# Patient Record
Sex: Female | Born: 1970 | Race: Black or African American | Hispanic: No | Marital: Married | Smoking: Never smoker
Health system: Southern US, Community
[De-identification: ages and names within clinical notes are randomized; demographics above are authoritative.]

## PROBLEM LIST (undated history)

## (undated) DIAGNOSIS — R7303 Prediabetes: Secondary | ICD-10-CM

## (undated) DIAGNOSIS — K5900 Constipation, unspecified: Secondary | ICD-10-CM

## (undated) DIAGNOSIS — M549 Dorsalgia, unspecified: Secondary | ICD-10-CM

## (undated) DIAGNOSIS — E538 Deficiency of other specified B group vitamins: Secondary | ICD-10-CM

## (undated) DIAGNOSIS — R0602 Shortness of breath: Secondary | ICD-10-CM

## (undated) DIAGNOSIS — E559 Vitamin D deficiency, unspecified: Secondary | ICD-10-CM

## (undated) DIAGNOSIS — M7989 Other specified soft tissue disorders: Secondary | ICD-10-CM

## (undated) DIAGNOSIS — E669 Obesity, unspecified: Secondary | ICD-10-CM

## (undated) HISTORY — DX: Shortness of breath: R06.02

## (undated) HISTORY — DX: Deficiency of other specified B group vitamins: E53.8

## (undated) HISTORY — DX: Dorsalgia, unspecified: M54.9

## (undated) HISTORY — DX: Constipation, unspecified: K59.00

## (undated) HISTORY — DX: Prediabetes: R73.03

## (undated) HISTORY — PX: NO PAST SURGERIES: SHX2092

## (undated) HISTORY — DX: Other specified soft tissue disorders: M79.89

## (undated) HISTORY — DX: Vitamin D deficiency, unspecified: E55.9

---

## 2011-02-02 ENCOUNTER — Inpatient Hospital Stay: Payer: Self-pay | Admitting: Obstetrics and Gynecology

## 2014-12-11 ENCOUNTER — Emergency Department: Payer: Self-pay | Admitting: Emergency Medicine

## 2019-10-29 ENCOUNTER — Other Ambulatory Visit: Payer: Self-pay | Admitting: Podiatry

## 2019-10-29 DIAGNOSIS — M25572 Pain in left ankle and joints of left foot: Secondary | ICD-10-CM

## 2019-11-03 ENCOUNTER — Other Ambulatory Visit: Payer: Self-pay

## 2019-11-03 ENCOUNTER — Ambulatory Visit
Admission: RE | Admit: 2019-11-03 | Discharge: 2019-11-03 | Disposition: A | Discharge status: Home / Self Care | Payer: BC Managed Care – PPO | Source: Ambulatory Visit | Attending: Podiatry | Admitting: Podiatry

## 2019-11-03 DIAGNOSIS — M25572 Pain in left ankle and joints of left foot: Secondary | ICD-10-CM

## 2019-11-26 ENCOUNTER — Other Ambulatory Visit: Payer: Self-pay

## 2019-11-26 ENCOUNTER — Encounter
Admission: RE | Admit: 2019-11-26 | Discharge: 2019-11-26 | Disposition: A | Discharge status: Home / Self Care | Payer: BC Managed Care – PPO | Source: Ambulatory Visit | Attending: Podiatry | Admitting: Podiatry

## 2019-11-26 HISTORY — DX: Obesity, unspecified: E66.9

## 2019-11-26 NOTE — Patient Instructions (Addendum)
Your procedure is scheduled on: Friday, March 5 Report to Day Surgery on the 2nd floor of the [facility] To find out your arrival time, please call [phone] between 1PM - 3PM on: Thursday, March 4  REMEMBER: Instructions that are not followed completely may result in serious medical risk, up to and including death; or upon the discretion of your surgeon and anesthesiologist your surgery may need to be rescheduled.  Do not eat food after midnight the night before surgery.  No gum chewing, lozengers or hard candies.  You may however, drink CLEAR liquids up to 2 hours before you are scheduled to arrive for your surgery. Do not drink anything within 2 hours of the start of your surgery.  Clear liquids include: - water  - apple juice without pulp - gatorade (not RED) - black coffee or tea (Do NOT add milk or creamers to the coffee or tea) Do NOT drink anything that is not on this list.  TAKE THESE MEDICATIONS THE MORNING OF SURGERY WITH A SIP OF WATER:  NONE  Stop Anti-inflammatories (NSAIDS) such as Advil, Aleve, Ibuprofen, Motrin, Naproxen, Naprosyn and Aspirin based products such as Excedrin, Goodys Powder, BC Powder. (May take Tylenol or Acetaminophen if needed.)  Stop ANY OVER THE COUNTER supplements until after surgery.  No Alcohol for 24 hours before or after surgery.  No Smoking including e-cigarettes for 24 hours prior to surgery.  No chewable tobacco products for at least 6 hours prior to surgery.  No nicotine patches on the day of surgery.  On the morning of surgery brush your teeth with toothpaste and water, you may rinse your mouth with mouthwash if you wish. Do not swallow any toothpaste or mouthwash.  Do not wear jewelry, make-up, hairpins, clips or nail polish.  Do not wear lotions, powders, or perfumes.   Do not shave 48 hours prior to surgery.   Contact lenses, hearing aids and dentures may not be worn into surgery.  Do not bring valuables to the  hospital, including drivers license, insurance or credit cards.  [facility] is not responsible for any belongings or valuables.   Use CHG Soap as directed on instruction sheet.  Notify your doctor if there is any change in your medical condition (cold, fever, infection).  Wear comfortable clothing (specific to your surgery type) to the hospital.  If you are being admitted to the hospital overnight, leave your suitcase in the car. After surgery it may be brought to your room.  If you are being discharged the day of surgery, you will not be allowed to drive home. You will need a responsible adult to drive you home and stay with you that night.   If you are taking public transportation, you will need to have a responsible adult with you. Please confirm with your physician that it is acceptable to use public transportation.   Please call [phone] if you have any questions about these instructions.   Visitation Policy:  Patients undergoing a surgery or procedure in a hospital may have one family member or support person with them as long as that person is not COVID-19 positive or experiencing its symptoms. That person may remain in the waiting area during the procedure. Should the patient need to stay at the hospital during part of their recovery, the support person may visit during visiting hours; 10 am to 8 pm.

## 2019-11-27 ENCOUNTER — Other Ambulatory Visit: Payer: Self-pay | Admitting: Podiatry

## 2019-12-03 ENCOUNTER — Other Ambulatory Visit: Payer: Self-pay

## 2019-12-03 ENCOUNTER — Other Ambulatory Visit
Admission: RE | Admit: 2019-12-03 | Discharge: 2019-12-03 | Disposition: A | Discharge status: Home / Self Care | Payer: BC Managed Care – PPO | Source: Ambulatory Visit | Attending: Podiatry | Admitting: Podiatry

## 2019-12-03 DIAGNOSIS — Z20822 Contact with and (suspected) exposure to covid-19: Secondary | ICD-10-CM | POA: Diagnosis not present

## 2019-12-03 DIAGNOSIS — Z01812 Encounter for preprocedural laboratory examination: Secondary | ICD-10-CM | POA: Insufficient documentation

## 2019-12-03 LAB — BASIC METABOLIC PANEL
Anion gap: 12 (ref 5–15)
BUN: 16 mg/dL (ref 6–20)
CO2: 26 mmol/L (ref 22–32)
Calcium: 9.1 mg/dL (ref 8.9–10.3)
Chloride: 101 mmol/L (ref 98–111)
Creatinine, Ser: 0.81 mg/dL (ref 0.44–1.00)
GFR calc Af Amer: >60 mL/min (ref >60)
GFR calc non Af Amer: >60 mL/min (ref >60)
Glucose, Bld: 84 mg/dL (ref 70–99)
Potassium: 3.7 mmol/L (ref 3.5–5.1)
Sodium: 139 mmol/L (ref 135–145)

## 2019-12-03 LAB — SARS CORONAVIRUS 2 (TAT 6-24 HRS): SARS Coronavirus 2: NEGATIVE

## 2019-12-05 ENCOUNTER — Ambulatory Visit: Payer: BC Managed Care – PPO | Admitting: Anesthesiology

## 2019-12-05 ENCOUNTER — Ambulatory Visit
Admission: RE | Admit: 2019-12-05 | Discharge: 2019-12-05 | Disposition: A | Discharge status: Home / Self Care | Payer: BC Managed Care – PPO | Source: Ambulatory Visit | Attending: Podiatry | Admitting: Podiatry

## 2019-12-05 ENCOUNTER — Ambulatory Visit: Payer: BC Managed Care – PPO

## 2019-12-05 ENCOUNTER — Encounter: Payer: Self-pay | Admitting: Podiatry

## 2019-12-05 ENCOUNTER — Encounter
Admission: RE | Disposition: A | Discharge status: Home / Self Care | Payer: Self-pay | Source: Ambulatory Visit | Attending: Podiatry

## 2019-12-05 DIAGNOSIS — Z419 Encounter for procedure for purposes other than remedying health state, unspecified: Secondary | ICD-10-CM

## 2019-12-05 DIAGNOSIS — M19072 Primary osteoarthritis, left ankle and foot: Secondary | ICD-10-CM | POA: Diagnosis not present

## 2019-12-05 DIAGNOSIS — Z6841 Body Mass Index (BMI) 40.0 and over, adult: Secondary | ICD-10-CM | POA: Insufficient documentation

## 2019-12-05 HISTORY — PX: ANKLE ARTHROSCOPY: SHX545

## 2019-12-05 HISTORY — PX: ARTHROTOMY: SHX5728

## 2019-12-05 HISTORY — PX: FOOT ARTHRODESIS: SHX1655

## 2019-12-05 LAB — POCT PREGNANCY, URINE: Preg Test, Ur: NEGATIVE

## 2019-12-05 SURGERY — FUSION, JOINT, FOOT
Anesthesia: General | Site: Foot | Laterality: Left

## 2019-12-05 MED ORDER — MIDAZOLAM HCL 2 MG/2ML IJ SOLN
INTRAMUSCULAR | Status: AC
  Filled 2019-12-05: qty 2

## 2019-12-05 MED ORDER — FAMOTIDINE 20 MG PO TABS
ORAL_TABLET | ORAL | Status: AC
  Administered 2019-12-05: 20 mg via ORAL
  Filled 2019-12-05: qty 1

## 2019-12-05 MED ORDER — PHENYLEPHRINE HCL (PRESSORS) 10 MG/ML IV SOLN
INTRAVENOUS | Status: DC | PRN
  Administered 2019-12-05: 100 ug via INTRAVENOUS

## 2019-12-05 MED ORDER — EPINEPHRINE PF 1 MG/ML IJ SOLN
INTRAMUSCULAR | Status: AC
  Filled 2019-12-05: qty 1

## 2019-12-05 MED ORDER — ROCURONIUM BROMIDE 100 MG/10ML IV SOLN
INTRAVENOUS | Status: DC | PRN
  Administered 2019-12-05: 30 mg via INTRAVENOUS
  Administered 2019-12-05: 10 mg via INTRAVENOUS

## 2019-12-05 MED ORDER — BUPIVACAINE LIPOSOME 1.3 % IJ SUSP
INTRAMUSCULAR | Status: DC | PRN
  Administered 2019-12-05 (×2): 10 mL

## 2019-12-05 MED ORDER — FENTANYL CITRATE (PF) 100 MCG/2ML IJ SOLN
INTRAMUSCULAR | Status: AC
  Filled 2019-12-05: qty 2

## 2019-12-05 MED ORDER — PROMETHAZINE HCL 25 MG/ML IJ SOLN
6.2500 mg | INTRAMUSCULAR | Status: DC | PRN
  Administered 2019-12-05: 6.25 mg via INTRAVENOUS

## 2019-12-05 MED ORDER — BUPIVACAINE HCL (PF) 0.25 % IJ SOLN
INTRAMUSCULAR | Status: AC
  Filled 2019-12-05: qty 30

## 2019-12-05 MED ORDER — BUPIVACAINE LIPOSOME 1.3 % IJ SUSP
INTRAMUSCULAR | Status: AC
  Filled 2019-12-05: qty 20

## 2019-12-05 MED ORDER — SUCCINYLCHOLINE CHLORIDE 20 MG/ML IJ SOLN
INTRAMUSCULAR | Status: AC
  Filled 2019-12-05: qty 1

## 2019-12-05 MED ORDER — DEXAMETHASONE SODIUM PHOSPHATE 10 MG/ML IJ SOLN
INTRAMUSCULAR | Status: DC | PRN
  Administered 2019-12-05: 10 mg via INTRAVENOUS

## 2019-12-05 MED ORDER — FENTANYL CITRATE (PF) 100 MCG/2ML IJ SOLN
INTRAMUSCULAR | Status: AC
  Administered 2019-12-05: 25 ug via INTRAVENOUS
  Filled 2019-12-05: qty 2

## 2019-12-05 MED ORDER — PROPOFOL 10 MG/ML IV BOLUS
INTRAVENOUS | Status: AC
  Filled 2019-12-05: qty 20

## 2019-12-05 MED ORDER — ACETAMINOPHEN 10 MG/ML IV SOLN
INTRAVENOUS | Status: AC
  Filled 2019-12-05: qty 100

## 2019-12-05 MED ORDER — DEXMEDETOMIDINE HCL IN NACL 200 MCG/50ML IV SOLN
INTRAVENOUS | Status: AC
  Filled 2019-12-05: qty 50

## 2019-12-05 MED ORDER — ACETAMINOPHEN 10 MG/ML IV SOLN
INTRAVENOUS | Status: DC | PRN
  Administered 2019-12-05: 1000 mg via INTRAVENOUS

## 2019-12-05 MED ORDER — CEFAZOLIN SODIUM-DEXTROSE 2-4 GM/100ML-% IV SOLN: 2.0000 g | INTRAVENOUS | Status: DC

## 2019-12-05 MED ORDER — ASPIRIN EC 325 MG PO TBEC: 325.0000 mg | DELAYED_RELEASE_TABLET | Freq: Every day | ORAL | 3 refills | Status: AC

## 2019-12-05 MED ORDER — DEXAMETHASONE SODIUM PHOSPHATE 10 MG/ML IJ SOLN
INTRAMUSCULAR | Status: AC
  Filled 2019-12-05: qty 1

## 2019-12-05 MED ORDER — LACTATED RINGERS IV SOLN: INTRAVENOUS | Status: DC

## 2019-12-05 MED ORDER — PROPOFOL 500 MG/50ML IV EMUL
INTRAVENOUS | Status: AC
  Filled 2019-12-05: qty 50

## 2019-12-05 MED ORDER — OXYCODONE-ACETAMINOPHEN 5-325 MG PO TABS: 1.0000 | ORAL_TABLET | Freq: Four times a day (QID) | ORAL | 0 refills | Status: AC | PRN

## 2019-12-05 MED ORDER — LACTATED RINGERS IV SOLN: INTRAVENOUS | Status: DC | PRN

## 2019-12-05 MED ORDER — FENTANYL CITRATE (PF) 100 MCG/2ML IJ SOLN
INTRAMUSCULAR | Status: AC
  Administered 2019-12-05: 11:00:00 50 ug via INTRAVENOUS
  Filled 2019-12-05: qty 2

## 2019-12-05 MED ORDER — SUGAMMADEX SODIUM 500 MG/5ML IV SOLN
INTRAVENOUS | Status: DC | PRN
  Administered 2019-12-05: 200 mg via INTRAVENOUS

## 2019-12-05 MED ORDER — LIDOCAINE HCL (PF) 2 % IJ SOLN
INTRAMUSCULAR | Status: AC
  Filled 2019-12-05: qty 5

## 2019-12-05 MED ORDER — CEFAZOLIN SODIUM-DEXTROSE 2-4 GM/100ML-% IV SOLN
INTRAVENOUS | Status: AC
  Filled 2019-12-05: qty 100

## 2019-12-05 MED ORDER — FENTANYL CITRATE (PF) 100 MCG/2ML IJ SOLN
INTRAMUSCULAR | Status: DC | PRN
  Administered 2019-12-05 (×3): 50 ug via INTRAVENOUS
  Administered 2019-12-05 (×2): 25 ug via INTRAVENOUS

## 2019-12-05 MED ORDER — SEVOFLURANE IN SOLN
RESPIRATORY_TRACT | Status: AC
  Filled 2019-12-05: qty 250

## 2019-12-05 MED ORDER — PROPOFOL 10 MG/ML IV BOLUS
INTRAVENOUS | Status: DC | PRN
  Administered 2019-12-05: 30 mg via INTRAVENOUS
  Administered 2019-12-05: 200 mg via INTRAVENOUS

## 2019-12-05 MED ORDER — POVIDONE-IODINE 7.5 % EX SOLN
Freq: Once | CUTANEOUS | Status: DC
  Filled 2019-12-05: qty 118

## 2019-12-05 MED ORDER — SUCCINYLCHOLINE CHLORIDE 20 MG/ML IJ SOLN
INTRAMUSCULAR | Status: DC | PRN
  Administered 2019-12-05: 140 mg via INTRAVENOUS

## 2019-12-05 MED ORDER — ENOXAPARIN SODIUM 40 MG/0.4ML ~~LOC~~ SOLN
SUBCUTANEOUS | Status: AC
  Filled 2019-12-05: qty 0.4

## 2019-12-05 MED ORDER — FAMOTIDINE 20 MG PO TABS: 20.0000 mg | ORAL_TABLET | Freq: Once | ORAL | Status: AC

## 2019-12-05 MED ORDER — ROCURONIUM BROMIDE 50 MG/5ML IV SOLN
INTRAVENOUS | Status: AC
  Filled 2019-12-05: qty 1

## 2019-12-05 MED ORDER — LIDOCAINE HCL (CARDIAC) PF 100 MG/5ML IV SOSY
PREFILLED_SYRINGE | INTRAVENOUS | Status: DC | PRN
  Administered 2019-12-05: 100 mg via INTRAVENOUS

## 2019-12-05 MED ORDER — DEXMEDETOMIDINE HCL 200 MCG/2ML IV SOLN
INTRAVENOUS | Status: DC | PRN
  Administered 2019-12-05: 12 ug via INTRAVENOUS

## 2019-12-05 MED ORDER — ENOXAPARIN SODIUM 40 MG/0.4ML ~~LOC~~ SOLN
40.0000 mg | Freq: Once | SUBCUTANEOUS | Status: AC
  Administered 2019-12-05: 13:00:00 40 mg via SUBCUTANEOUS

## 2019-12-05 MED ORDER — PROMETHAZINE HCL 25 MG/ML IJ SOLN
INTRAMUSCULAR | Status: AC
  Filled 2019-12-05: qty 1

## 2019-12-05 MED ORDER — EPHEDRINE SULFATE 50 MG/ML IJ SOLN
INTRAMUSCULAR | Status: AC
  Filled 2019-12-05: qty 1

## 2019-12-05 MED ORDER — ONDANSETRON HCL 4 MG/2ML IJ SOLN
INTRAMUSCULAR | Status: AC
  Filled 2019-12-05: qty 2

## 2019-12-05 MED ORDER — BUPIVACAINE-EPINEPHRINE 0.25% -1:200000 IJ SOLN
INTRAMUSCULAR | Status: DC | PRN
  Administered 2019-12-05: 20 mL
  Administered 2019-12-05: 10 mL

## 2019-12-05 MED ORDER — LIDOCAINE HCL (PF) 1 % IJ SOLN
INTRAMUSCULAR | Status: AC
  Filled 2019-12-05: qty 30

## 2019-12-05 MED ORDER — FENTANYL CITRATE (PF) 100 MCG/2ML IJ SOLN
25.0000 ug | INTRAMUSCULAR | Status: DC | PRN
  Administered 2019-12-05 (×3): 25 ug via INTRAVENOUS

## 2019-12-05 MED ORDER — LIDOCAINE-EPINEPHRINE 1 %-1:100000 IJ SOLN
INTRAMUSCULAR | Status: AC
  Filled 2019-12-05: qty 1

## 2019-12-05 SURGICAL SUPPLY — 79 items
2.0 DRILL BIT STRATUM ×4 IMPLANT
BASIN GRAD PLASTIC 32OZ STRL (MISCELLANEOUS) ×4 IMPLANT
BIT DRILL 2.0 (BIT) ×1
BIT DRILL 2XNS DISP SS SM FRAG (BIT) ×3 IMPLANT
BIT DRILL STRATUM ST W/SLV 2.7 (BIT) ×3 IMPLANT
BIT DRL 2XNS DISP SS SM FRAG (BIT) ×3
BLADE FULL RADIUS 2.9 (BLADE) ×4 IMPLANT
BLADE SURG 15 STRL LF DISP TIS (BLADE) ×6 IMPLANT
BLADE SURG 15 STRL SS (BLADE) ×2
BNDG COHESIVE 4X5 TAN STRL (GAUZE/BANDAGES/DRESSINGS) ×4 IMPLANT
BNDG CONFORM 2 STRL LF (GAUZE/BANDAGES/DRESSINGS) ×4 IMPLANT
BNDG CONFORM 3 STRL LF (GAUZE/BANDAGES/DRESSINGS) ×4 IMPLANT
BNDG ELASTIC 4X5.8 VLCR NS LF (GAUZE/BANDAGES/DRESSINGS) ×8 IMPLANT
BNDG ESMARK 4X12 TAN STRL LF (GAUZE/BANDAGES/DRESSINGS) ×4 IMPLANT
BNDG GAUZE 4.5X4.1 6PLY STRL (MISCELLANEOUS) ×4 IMPLANT
BOOT STEPPER DURA MED (SOFTGOODS) ×4 IMPLANT
BUR 4X45 EGG (BURR) ×4 IMPLANT
BUR AGGRESSIVE+ 2.5 (BURR) IMPLANT
CANISTER SUCT 1200ML W/VALVE (MISCELLANEOUS) ×4 IMPLANT
COVER PIN YLW 0.028-062 (MISCELLANEOUS) ×8 IMPLANT
COVER WAND RF STERILE (DRAPES) ×4 IMPLANT
CUFF TOURN SGL QUICK 18X4 (TOURNIQUET CUFF) IMPLANT
CUFF TOURN SGL QUICK 24 (TOURNIQUET CUFF) ×1
CUFF TRNQT CYL 24X4X16.5-23 (TOURNIQUET CUFF) ×3 IMPLANT
DRAPE C-ARM XRAY 36X54 (DRAPES) ×4 IMPLANT
DRAPE C-ARMOR (DRAPES) IMPLANT
DRAPE FLUOR MINI C-ARM 54X84 (DRAPES) ×4 IMPLANT
DRILL STRATUM ST W/SLV 2.7 (BIT) ×4
DURAPREP 26ML APPLICATOR (WOUND CARE) ×4 IMPLANT
ELECT REM PT RETURN 9FT ADLT (ELECTROSURGICAL) ×4
ELECTRODE REM PT RTRN 9FT ADLT (ELECTROSURGICAL) ×3 IMPLANT
GAUZE SPONGE 4X4 12PLY STRL (GAUZE/BANDAGES/DRESSINGS) ×4 IMPLANT
GAUZE XEROFORM 1X8 LF (GAUZE/BANDAGES/DRESSINGS) ×4 IMPLANT
GLOVE BIO SURGEON STRL SZ7.5 (GLOVE) ×4 IMPLANT
GLOVE BIO SURGEON STRL SZ8 (GLOVE) ×4 IMPLANT
GLOVE INDICATOR 8.0 STRL GRN (GLOVE) ×4 IMPLANT
GOWN STRL REUS W/ TWL LRG LVL3 (GOWN DISPOSABLE) ×6 IMPLANT
GOWN STRL REUS W/TWL LRG LVL3 (GOWN DISPOSABLE) ×2
GRAFT TRIN ELITE MED MUSC TRAN (Graft) ×4 IMPLANT
IV D 5 LR 1000ML (IV SOLUTION) ×4 IMPLANT
IV NS 1000ML (IV SOLUTION) ×1
IV NS 1000ML BAXH (IV SOLUTION) ×3 IMPLANT
IV NS IRRIG 3000ML ARTHROMATIC (IV SOLUTION) ×4 IMPLANT
K-WIRE ACE 1.6X6 (WIRE) ×16
KIT STRATUM INSTRUMENT STD (KITS) ×4 IMPLANT
KIT TURNOVER KIT A (KITS) ×4 IMPLANT
KWIRE ACE 1.6X6 (WIRE) ×12 IMPLANT
NEEDLE FILTER BLUNT 18X 1/2SAF (NEEDLE) ×2
NEEDLE FILTER BLUNT 18X1 1/2 (NEEDLE) ×6 IMPLANT
NEEDLE HYPO 25X1 1.5 SAFETY (NEEDLE) ×8 IMPLANT
NS IRRIG 1000ML POUR BTL (IV SOLUTION) ×12 IMPLANT
PACK EXTREMITY ARMC (MISCELLANEOUS) ×4 IMPLANT
PADDING CAST BLEND 4X4 NS (MISCELLANEOUS) ×4 IMPLANT
PLATE TN STRM LG LT (Plate) ×4 IMPLANT
RASP SM TEAR CROSS CUT (RASP) ×4 IMPLANT
SCREW LOCK ST STRM 2.7X18 (Screw) ×4 IMPLANT
SCREW LOCK ST STRM 2.7X24 (Screw) ×4 IMPLANT
SCREW LOCK STRATUM 3.5X18 (Screw) ×8 IMPLANT
SCREW NL LP ST STRM 2.7X24 (Screw) IMPLANT
SCREW STRATUM NL LP ST 3.5X24 (Screw) ×4 IMPLANT
SPLINT CAST 1 STEP 4X30 (MISCELLANEOUS) ×4 IMPLANT
SPLINT FAST PLASTER 5X30 (CAST SUPPLIES) ×1
SPLINT PLASTER CAST FAST 5X30 (CAST SUPPLIES) ×3 IMPLANT
SPONGE XRAY 4X4 16PLY STRL (MISCELLANEOUS) ×4 IMPLANT
STOCKINETTE M/LG 89821 (MISCELLANEOUS) ×4 IMPLANT
STRIP CLOSURE SKIN 1/4X4 (GAUZE/BANDAGES/DRESSINGS) ×4 IMPLANT
SUT ETHILON 3-0 FS-10 30 BLK (SUTURE) ×8
SUT ETHILON 4-0 (SUTURE) ×1
SUT ETHILON 4-0 FS2 18XMFL BLK (SUTURE) ×3
SUT VIC AB 3-0 SH 27 (SUTURE) ×2
SUT VIC AB 3-0 SH 27X BRD (SUTURE) ×6 IMPLANT
SUT VIC AB 4-0 FS2 27 (SUTURE) IMPLANT
SUTURE EHLN 3-0 FS-10 30 BLK (SUTURE) ×6 IMPLANT
SUTURE ETHLN 4-0 FS2 18XMF BLK (SUTURE) ×3 IMPLANT
SYR 10ML LL (SYRINGE) ×8 IMPLANT
Stratum foot plating system ×4 IMPLANT
TOWEL OR 17X26 4PK STRL BLUE (TOWEL DISPOSABLE) ×4 IMPLANT
TUBING ARTHRO INFLOW-ONLY STRL (TUBING) ×4 IMPLANT
WIRE Z .062 C-WIRE SPADE TIP (WIRE) IMPLANT

## 2019-12-05 NOTE — H&P (Signed)
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  The various methods of treatment have been discussed with the patient.  No guarantees were given.  After consideration of risks, benefits and other options for treatment, the patient has consented to surgery.  I have reviewed the patients' chart and labs.     A history and physical examination was performed in my office.  The patient was reexamined.  There have been no changes to this history and physical examination.  Gwyneth Revels A

## 2019-12-05 NOTE — Anesthesia Preprocedure Evaluation (Signed)
Anesthesia Evaluation  Patient identified by MRN, date of birth, ID band Patient awake    Reviewed: Allergy & Precautions, H&P , NPO status , Patient's Chart, lab work & pertinent test results, reviewed documented beta blocker date and time   History of Anesthesia Complications Negative for: history of anesthetic complications  Airway Mallampati: IV  TM Distance: >3 FB Neck ROM: full    Dental  (+) Dental Advidsory Given, Teeth Intact, Missing   Pulmonary neg pulmonary ROS,    Pulmonary exam normal        Cardiovascular Exercise Tolerance: Good negative cardio ROS Normal cardiovascular exam     Neuro/Psych negative neurological ROS  negative psych ROS   GI/Hepatic negative GI ROS, Neg liver ROS,   Endo/Other  neg diabetesMorbid obesity  Renal/GU negative Renal ROS  negative genitourinary   Musculoskeletal   Abdominal   Peds  Hematology negative hematology ROS (+)   Anesthesia Other Findings Past Medical History: No date: Obesity   Reproductive/Obstetrics negative OB ROS                             Anesthesia Physical Anesthesia Plan  ASA: III  Anesthesia Plan: General   Post-op Pain Management:    Induction: Intravenous  PONV Risk Score and Plan: 3 and Ondansetron, Dexamethasone, Midazolam, Promethazine and Treatment may vary due to age or medical condition  Airway Management Planned: Oral ETT  Additional Equipment:   Intra-op Plan:   Post-operative Plan: Extubation in OR  Informed Consent: I have reviewed the patients History and Physical, chart, labs and discussed the procedure including the risks, benefits and alternatives for the proposed anesthesia with the patient or authorized representative who has indicated his/her understanding and acceptance.     Dental Advisory Given  Plan Discussed with: Anesthesiologist, CRNA and Surgeon  Anesthesia Plan Comments:          Anesthesia Quick Evaluation

## 2019-12-05 NOTE — Transfer of Care (Signed)
Immediate Anesthesia Transfer of Care Note  Patient: Renee Brennan  Procedure(s) Performed: ARTHRODESIS FOOT T-N,C-C OR MET-CUNEIFORM LEFT (Left Foot) ARTHROTOMY (Left ) SUBTALOR ARTHROSCOPY (Left Ankle)  Patient Location: PACU  Anesthesia Type:General  Level of Consciousness: sedated  Airway & Oxygen Therapy: Patient Spontanous Breathing and Patient connected to face mask oxygen  Post-op Assessment: Report given to RN and Post -op Vital signs reviewed and stable  Post vital signs: Reviewed and stable  Last Vitals:  Vitals Value Taken Time  BP 117/74 12/05/19 1105  Temp 36.6 C 12/05/19 1105  Pulse 97 12/05/19 1109  Resp 22 12/05/19 1109  SpO2 97 % 12/05/19 1109  Vitals shown include unvalidated device data.  Last Pain:  Vitals:   12/05/19 0614  TempSrc: Tympanic  PainSc: 4          Complications: No apparent anesthesia complications

## 2019-12-05 NOTE — Anesthesia Procedure Notes (Signed)
Procedure Name: Intubation Date/Time: 12/05/2019 7:44 AM Performed by: Almeta Monas, CRNA Pre-anesthesia Checklist: Patient identified, Patient being monitored, Timeout performed, Emergency Drugs available and Suction available Patient Re-evaluated:Patient Re-evaluated prior to induction Oxygen Delivery Method: Circle system utilized Preoxygenation: Pre-oxygenation with 100% oxygen Induction Type: IV induction Ventilation: Mask ventilation without difficulty Laryngoscope Size: 3 and McGraph Grade View: Grade I Tube type: Oral Tube size: 7.0 mm Number of attempts: 1 Airway Equipment and Method: Stylet Placement Confirmation: ETT inserted through vocal cords under direct vision,  positive ETCO2 and breath sounds checked- equal and bilateral Secured at: 21 cm Tube secured with: Tape Dental Injury: Teeth and Oropharynx as per pre-operative assessment  Difficulty Due To: Difficulty was anticipated and Difficult Airway- due to large tongue Future Recommendations: Recommend- induction with short-acting agent, and alternative techniques readily available

## 2019-12-05 NOTE — Op Note (Signed)
Operative note   Surgeon:Manon Banbury Lawyer: None    Preop diagnosis: 1.  Talonavicular arthritis left foot 2.  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  Talonavicular arthrodesis left foot 2.  Subtalar joint arthroscopy with synovectomy and extensive debridement left subtalar joint    EBL: 10 mL    Anesthesia:local and general.  Local consisted of a total of 20 mL of Exparel long-acting anesthetic and 30 mL of 0.25% bupivacaine with epinephrine    Hemostasis: Mid calf tourniquet inflated to 200 mmHg for 120 minutes    Specimen: None    Complications: None    Operative indications:Renee Brennan is an 49 y.o. that presents today for surgical intervention.  The risks/benefits/alternatives/complications have been discussed and consent has been given.    Procedure:  Patient was brought into the OR and placed on the operating table in thesupine position. After anesthesia was obtained theleft lower extremity was prepped and draped in usual sterile fashion.  Attention was directed to the anterolateral aspect of the left subtalar joint region where a small incision was made overlying the sinus tarsi and subtalar joint.  The deep tissue was left intact.  This was a proximate 2 cm incision.  Initially the small 2.7 mm arthroscope was placed into the subtalar joint.  A large amount of synovitis and nonviable fibrotic tissue was noted.  With a small shaver extensive debridement and removal of fibrotic tissue was removed from the subtalar joint at the level of the sinus tarsi region.  Good excision and removal of inflammatory tissue was noted.  The wound was flushed with copious amounts of irrigation.  Closure was then performed with a 3-0 Vicryl the deeper and subcutaneous tissue and a 3-0 nylon for skin.  Next the tourniquet was inflated and attention was directed to the dorsal aspect of the foot where longitudinal incision  was made just medial to the anterior tibial tendon.  Sharp and blunt dissection carried down to the periosteum.  Subperiosteal dissection was undertaken both medial and lateral and the entire talonavicular joint was exposed.  There was noted to be a large synostotic bridge on the dorsal aspect and marked limitation of range of motion of the talonavicular joint was noted at this time.  Care was taken to remove the synostosis and the joint was then opened.  All residual articular cartilage was removed from the talonavicular joint at this time.  The joint was then prepped with a 2.0 mm drill bit.  The area was then filtrated with 10 mL of Trinity bone graft into the joint.  Next using standard technique a large talonavicular arthrodesis plate was placed on the dorsal medial talonavicular joint with compression.  This was a Biomet Stratum large talonavicular arthrodesis plate.  Excellent compression was noted and stability was noted.  At this point the wound was then flushed with copious amounts of irrigation.  Closure was then performed with a 3-0 Vicryl the deeper and subcutaneous tissue and a 3-0 nylon for skin.  The area was infiltrated with residual local anesthetic.  A large compression dressing was placed to the left foot and ankle.    Patient tolerated the procedure and anesthesia well.  Was transported from the OR to the PACU with all vital signs stable and vascular status intact. To be discharged per routine protocol.  Will follow up in approximately 1 week in the outpatient clinic.  Patient was  placed in an equalizer walker boot in the PACU.  Also a Percocet prescription was sent to her pharmacy.

## 2019-12-05 NOTE — Anesthesia Postprocedure Evaluation (Signed)
Anesthesia Post Note  Patient: Renee Brennan  Procedure(s) Performed: ARTHRODESIS FOOT T-N,C-C OR MET-CUNEIFORM LEFT (Left Foot) ARTHROTOMY (Left ) SUBTALOR ARTHROSCOPY (Left Ankle)  Patient location during evaluation: PACU Anesthesia Type: General Level of consciousness: awake and alert Pain management: pain level controlled Vital Signs Assessment: post-procedure vital signs reviewed and stable Respiratory status: spontaneous breathing, nonlabored ventilation, respiratory function stable and patient connected to nasal cannula oxygen Cardiovascular status: blood pressure returned to baseline and stable Postop Assessment: no apparent nausea or vomiting Anesthetic complications: no     Last Vitals:  Vitals:   12/05/19 1233 12/05/19 1250  BP: 94/72 128/79  Pulse: 62 69  Resp: 18 18  Temp: (!) 36.3 C   SpO2: 94% 100%    Last Pain:  Vitals:   12/05/19 1233  TempSrc: Temporal  PainSc: 4                  Martha Clan

## 2019-12-05 NOTE — Discharge Instructions (Addendum)
[facility]  POST OPERATIVE INSTRUCTIONS FOR DR. TROXLER, DR. Ether Griffins, AND [facility]  1. Take your medication as prescribed.  Pain medication should be taken only as needed.  2. Keep the dressing clean, dry and intact.  3. Keep your foot elevated above the heart level for the first 48 hours.  4. Walking to the bathroom and brief periods of walking are acceptable, unless we have instructed you to be non-weight bearing.  5. Always wear your post-op shoe when walking.  Always use your crutches if you are to be non-weight bearing.  6. Do not take a shower. Baths are permissible as long as the foot is kept out of the water.   7. Every hour you are awake:  - Bend your knee 15 times. - Flex foot 15 times - Massage calf 15 times  8. Call [facility] [phone]) if any of the following problems occur: - You develop a temperature or fever. - The bandage becomes saturated with blood. - Medication does not stop your pain. - Injury of the foot occurs. - Any symptoms of infection including redness, odor, or red streaks running from wound.    YOU ARE NON WEIGHT BEARING PER DR. Ether Griffins    AMBULATORY SURGERY  DISCHARGE INSTRUCTIONS   1) The drugs that you were given will stay in your system until tomorrow so for the next 24 hours you should not:  A) Drive an automobile B) Make any legal decisions C) Drink any alcoholic beverage   2) You may resume regular meals tomorrow.  Today it is better to start with liquids and gradually work up to solid foods.  You may eat anything you prefer, but it is better to start with liquids, then soup and crackers, and gradually work up to solid foods.   3) Please notify your doctor immediately if you have any unusual bleeding, trouble breathing, redness and pain at the surgery site, drainage, fever, or pain not relieved by medication.    4) Additional  Instructions:        Please contact your physician with any problems or Same Day Surgery at [phone], Monday through Friday 6 am to 4 pm, or [place] [facility] number at [phone].

## 2020-08-21 ENCOUNTER — Ambulatory Visit: Payer: Self-pay | Attending: Internal Medicine

## 2020-08-21 DIAGNOSIS — Z23 Encounter for immunization: Secondary | ICD-10-CM

## 2020-08-21 NOTE — Progress Notes (Signed)
° °  Covid-19 Vaccination Clinic  Name:  Renee Brennan    MRN: [id number] DOB: 08-20-1971  08/21/2020  [name] was observed post Covid-19 immunization for 15 minutes without incident. She was provided with Vaccine Information Sheet and instruction to access the V-Safe system.   [name] was instructed to call 911 with any severe reactions post vaccine:  Difficulty breathing   Swelling of face and throat   A fast heartbeat   A bad rash all over body   Dizziness and weakness   Immunizations Administered    Name Date Dose VIS Date Route   Pfizer COVID-19 Vaccine 08/21/2020 12:57 PM 0.3 mL 07/21/2020 Intramuscular   Manufacturer: [employer], [employer]   Lot: [id number]   NDC: [address]-5

## 2020-09-11 ENCOUNTER — Ambulatory Visit: Payer: Self-pay | Attending: Internal Medicine

## 2020-09-11 DIAGNOSIS — Z23 Encounter for immunization: Secondary | ICD-10-CM

## 2020-09-11 NOTE — Progress Notes (Signed)
° °  Covid-19 Vaccination Clinic  Name:  CORTINA VULTAGGIO    MRN: [id number] DOB: April 14, 1971  09/11/2020  Ms. Rossi was observed post Covid-19 immunization for 15 minutes without incident. She was provided with Vaccine Information Sheet and instruction to access the V-Safe system.   Ms. Schlueter was instructed to call 911 with any severe reactions post vaccine:  Difficulty breathing   Swelling of face and throat   A fast heartbeat   A bad rash all over body   Dizziness and weakness   Immunizations Administered    Name Date Dose VIS Date Route   Pfizer COVID-19 Vaccine 09/11/2020 10:01 AM 0.3 mL 07/21/2020 Intramuscular   Manufacturer: [employer]   Lot: [id number]   NDC: [id number]

## 2020-09-17 ENCOUNTER — Other Ambulatory Visit: Payer: Self-pay | Admitting: Internal Medicine

## 2020-09-17 DIAGNOSIS — Z1231 Encounter for screening mammogram for malignant neoplasm of breast: Secondary | ICD-10-CM

## 2020-11-30 ENCOUNTER — Other Ambulatory Visit: Payer: Self-pay

## 2020-11-30 ENCOUNTER — Ambulatory Visit
Admission: RE | Admit: 2020-11-30 | Discharge: 2020-11-30 | Disposition: A | Discharge status: Home / Self Care | Payer: 59 | Source: Ambulatory Visit | Attending: Internal Medicine | Admitting: Internal Medicine

## 2020-11-30 DIAGNOSIS — Z1231 Encounter for screening mammogram for malignant neoplasm of breast: Secondary | ICD-10-CM | POA: Diagnosis present

## 2021-06-25 LAB — COLOGUARD: COLOGUARD: NEGATIVE

## 2021-11-08 IMAGING — MG MM DIGITAL SCREENING BILAT W/ TOMO AND CAD
8 of 15 series · 8 of 40 positions shown · non-contrast
Comparison: None.

ACR Breast Density Category a: The breast tissue is almost entirely
fatty.

CLINICAL DATA: Screening.

EXAM:
DIGITAL SCREENING BILATERAL MAMMOGRAM WITH TOMOSYNTHESIS AND CAD
TECHNIQUE: Bilateral screening digital craniocaudal and mediolateral oblique
mammograms were obtained. Bilateral screening digital breast
tomosynthesis was performed. The images were evaluated with
computer-aided detection.

[R XCCL synth-2D]
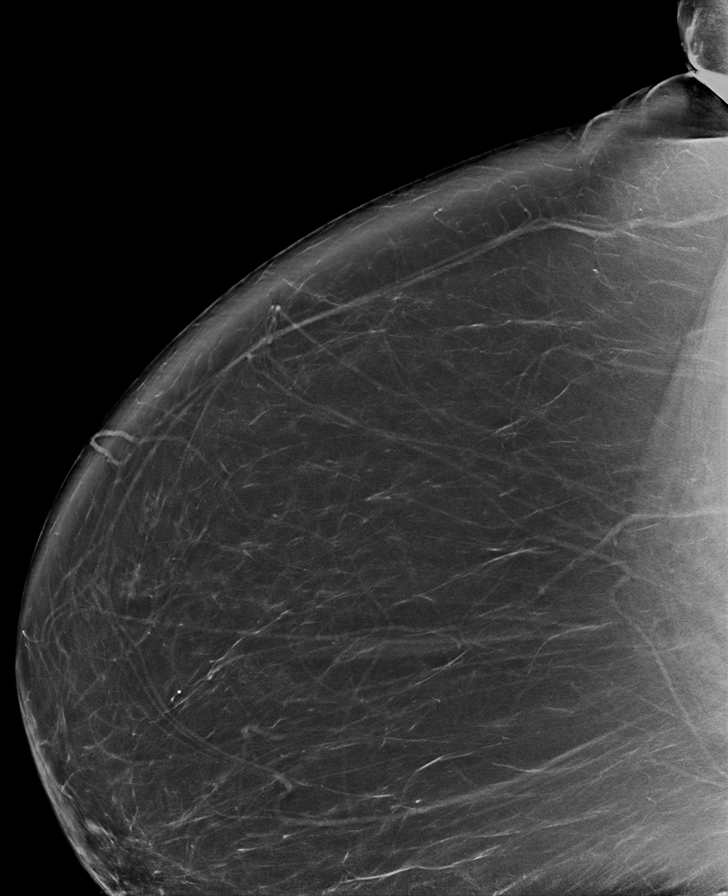

[L MLO synth-2D (1 of 2)]
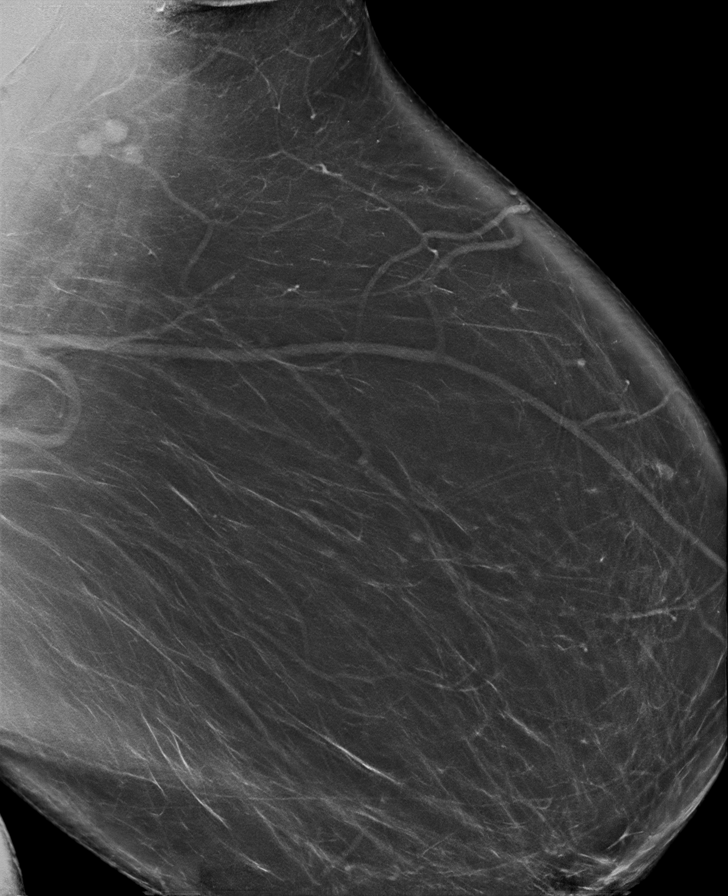

[R MLO synth-2D (1 of 2)]
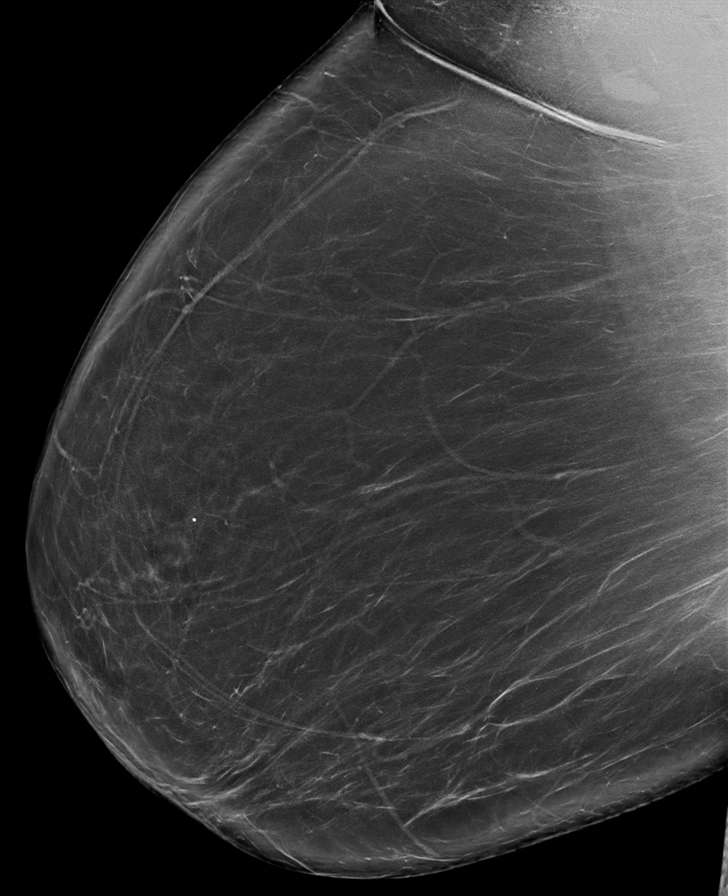

[L CC synth-2D (1 of 2)]
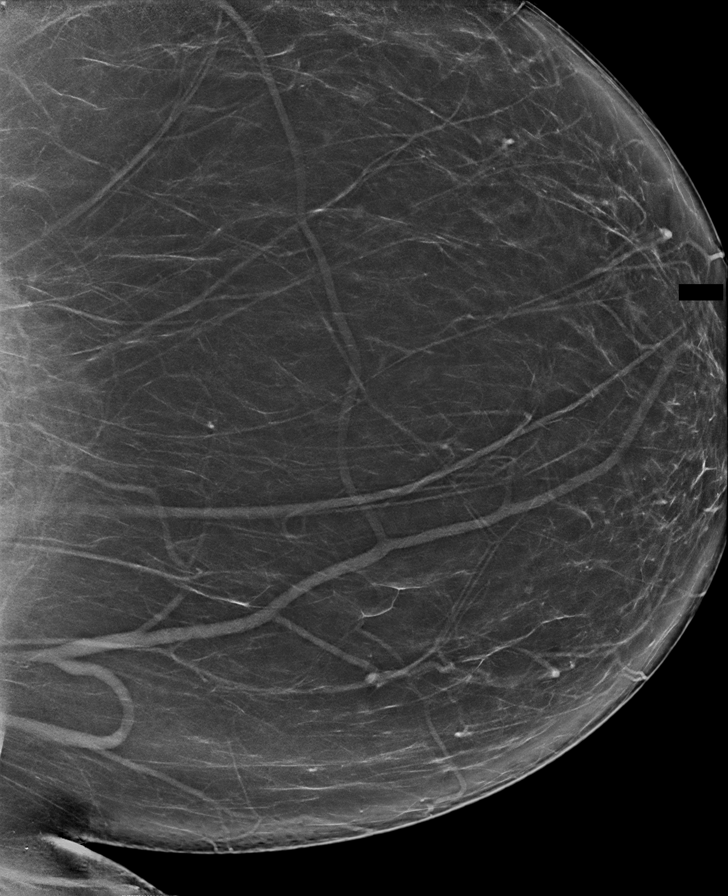

[R MLO synth-2D (2 of 2)]
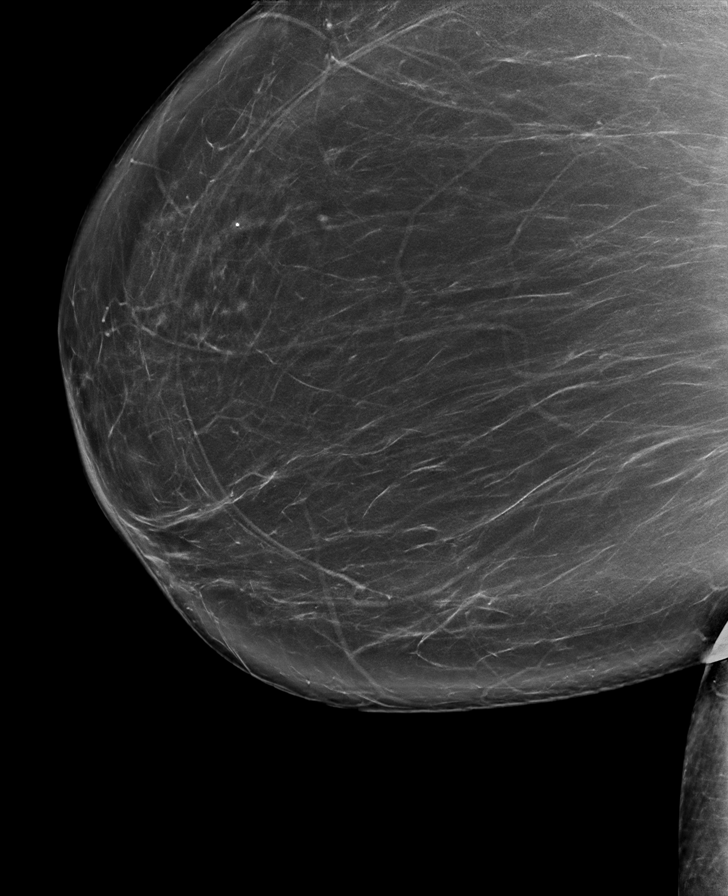

[L CC synth-2D (2 of 2)]
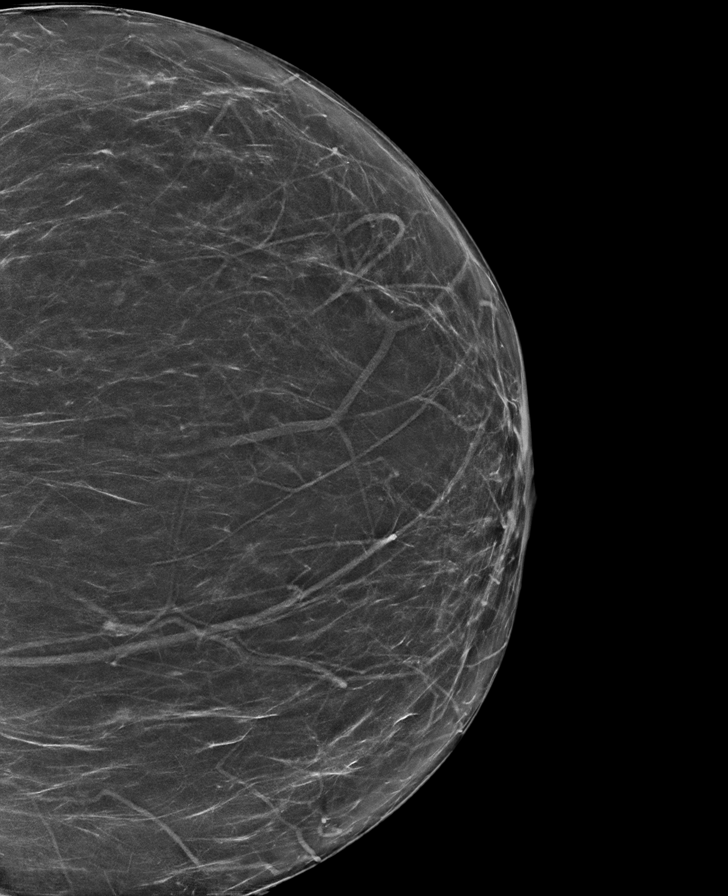

[L MLO synth-2D (2 of 2)]
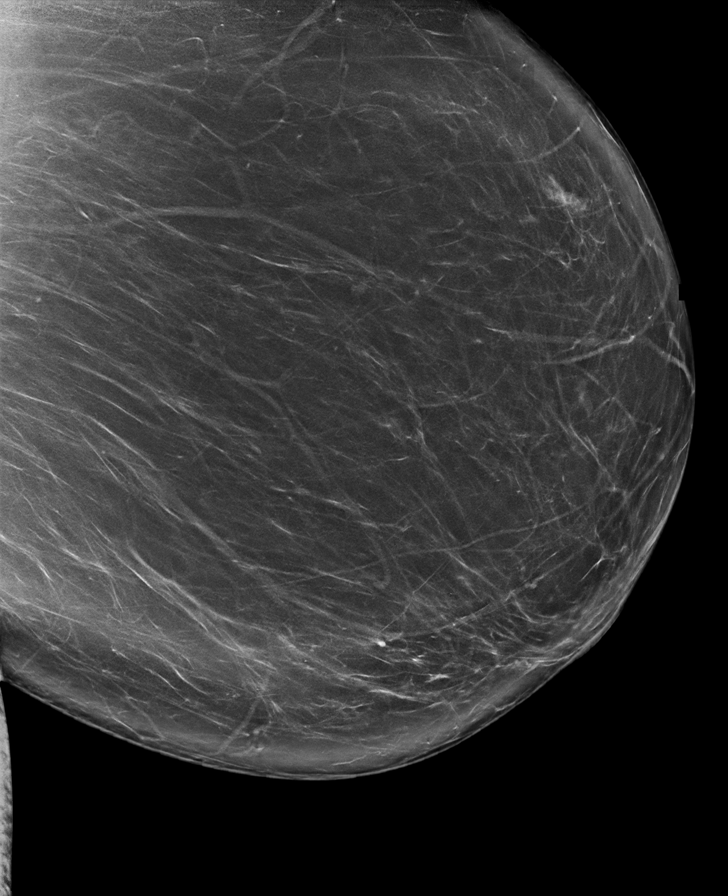

[L CC tomo · tomo slice 47/92.0]
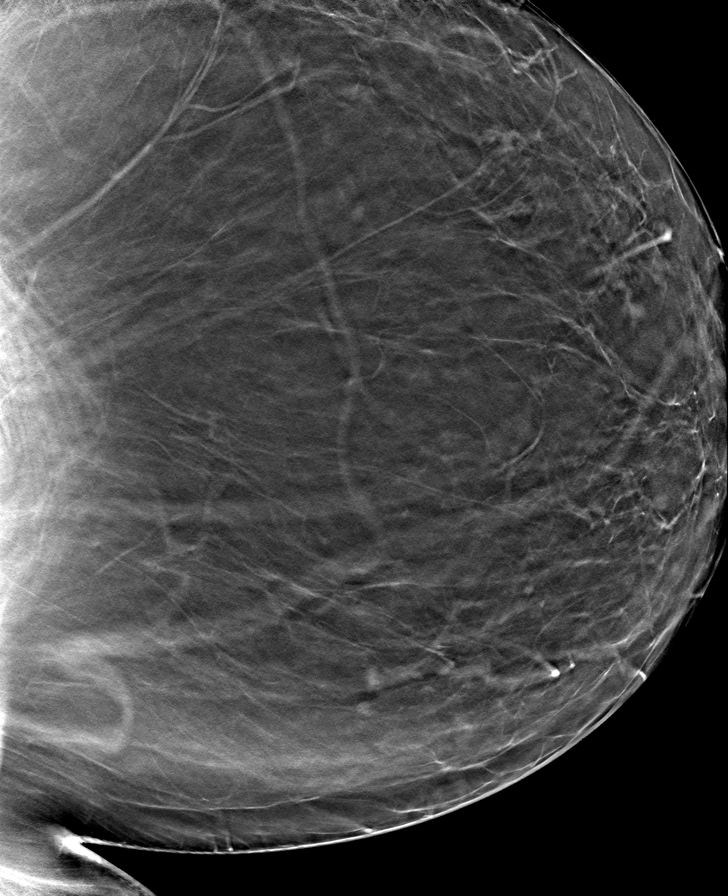

[8 of 40 positions shown; findings below may reference images not displayed]

FINDINGS: There are no findings suspicious for malignancy. The images were
evaluated with computer-aided detection.
IMPRESSION: No mammographic evidence of malignancy. A result letter of this
screening mammogram will be mailed directly to the patient.

RECOMMENDATION:
Screening mammogram in one year. (Code:[id number])

BI-RADS CATEGORY  1: Negative.

## 2022-12-12 DIAGNOSIS — R5382 Chronic fatigue, unspecified: Secondary | ICD-10-CM | POA: Diagnosis not present

## 2022-12-12 DIAGNOSIS — R7303 Prediabetes: Secondary | ICD-10-CM | POA: Diagnosis not present

## 2022-12-12 DIAGNOSIS — Z6841 Body Mass Index (BMI) 40.0 and over, adult: Secondary | ICD-10-CM | POA: Diagnosis not present

## 2022-12-12 DIAGNOSIS — E559 Vitamin D deficiency, unspecified: Secondary | ICD-10-CM | POA: Diagnosis not present

## 2022-12-12 DIAGNOSIS — R030 Elevated blood-pressure reading, without diagnosis of hypertension: Secondary | ICD-10-CM | POA: Diagnosis not present

## 2023-02-13 DIAGNOSIS — Z6841 Body Mass Index (BMI) 40.0 and over, adult: Secondary | ICD-10-CM | POA: Diagnosis not present

## 2023-02-13 DIAGNOSIS — G4733 Obstructive sleep apnea (adult) (pediatric): Secondary | ICD-10-CM | POA: Diagnosis not present

## 2023-02-13 DIAGNOSIS — R5382 Chronic fatigue, unspecified: Secondary | ICD-10-CM | POA: Diagnosis not present

## 2023-03-13 DIAGNOSIS — Z0289 Encounter for other administrative examinations: Secondary | ICD-10-CM

## 2023-03-14 ENCOUNTER — Ambulatory Visit (INDEPENDENT_AMBULATORY_CARE_PROVIDER_SITE_OTHER): Payer: 59 | Admitting: Adult Health

## 2023-03-14 ENCOUNTER — Encounter (INDEPENDENT_AMBULATORY_CARE_PROVIDER_SITE_OTHER): Payer: Self-pay | Admitting: Adult Health

## 2023-03-14 VITALS — BP 120/78 | HR 94 | Temp 98.3°F | Ht 62.0 in | Wt 332.0 lb

## 2023-03-14 DIAGNOSIS — Z6841 Body Mass Index (BMI) 40.0 and over, adult: Secondary | ICD-10-CM | POA: Diagnosis not present

## 2023-03-14 DIAGNOSIS — E559 Vitamin D deficiency, unspecified: Secondary | ICD-10-CM | POA: Diagnosis not present

## 2023-03-14 DIAGNOSIS — Z0000 Encounter for general adult medical examination without abnormal findings: Secondary | ICD-10-CM

## 2023-03-14 NOTE — Progress Notes (Signed)
Office: [phone]  /  Fax: [phone]   Initial Visit  Renee Brennan was seen in clinic today to evaluate for obesity. She is interested in losing weight to improve overall health and reduce the risk of weight related complications. She presents today to review program treatment options, initial physical assessment, and evaluation.     She was referred by: PCP  When asked what else they would like to accomplish? She states: Adopt healthier eating patterns, Improve quality of life, and Lose a target amount of weight : loss 160 lbs down to 170s- last time she weighed this was in [employer]  Weight history: In her 45s and 30s she weighed 220s, then last pregnancy age 66- weight gain since then  When asked how has your weight affected you? 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   Current or previous pharmacotherapy: GLP-1  Response to medication: Other: Ozempic, 40 lb weight loss then insurance stopped coverage Fall 2023   Past medical history includes:   Past Medical History:  Diagnosis Date   Obesity      Objective:   BP 120/78   Pulse 94   Temp 98.3 F (36.8 C)   Ht 5\' 2"  (1.575 m)   Wt (!) 332 lb (150.6 kg)   SpO2 98%   BMI 60.72 kg/m  She was weighed on the bioimpedance scale: Body mass index is 60.72 kg/m.  Peak Weight:347 lbs , Body Fat%:60.1, Visceral Fat Rating:27, Weight trend over the last 12 months: Increasing  General:  Alert, oriented and  cooperative. Patient is in no acute distress.  Respiratory: Normal respiratory effort, no problems with respiration noted   Gait: able to ambulate independently  Mental Status: Normal mood and affect. Normal behavior. Normal judgment and thought content.   DIAGNOSTIC DATA REVIEWED:  BMET    Component Value Date/Time   NA 139 12/03/2019 1006   K 3.7 12/03/2019 1006   CL 101 12/03/2019 1006   CO2 26 12/03/2019 1006   GLUCOSE 84 12/03/2019 1006   BUN 16 12/03/2019 1006   CREATININE 0.81 12/03/2019 1006   CALCIUM 9.1 12/03/2019 1006   GFRNONAA >60 12/03/2019 1006   GFRAA >60 12/03/2019 1006   No results found for: "HGBA1C" No results found for: "INSULIN" CBC No results found for: "WBC", "RBC", "HGB", "HCT", "PLT", "MCV", "MCH", "MCHC", "RDW" Iron/TIBC/Ferritin/ %Sat No results found for: "IRON", "TIBC", "FERRITIN", "IRONPCTSAT" Lipid Panel  No results found for: "CHOL", "TRIG", "HDL", "CHOLHDL", "VLDL", "LDLCALC", "LDLDIRECT" Hepatic Function Panel  No results found for: "PROT", "ALBUMIN", "AST", "ALT", "ALKPHOS", "BILITOT", "BILIDIR", "IBILI" No results found for: "TSH"   Assessment and Plan:   Healthcare maintenance  Morbid obesity (HCC), Starting BMI 60.9  ESTABLISH WITH HWW    Obesity Treatment / Action Plan:  Patient will work on garnering support from family and friends to begin weight loss journey. Will work on eliminating or reducing the presence of highly palatable, calorie dense foods in the home. Will complete provided nutritional and psychosocial assessment questionnaire before the next appointment. Will be scheduled for indirect calorimetry to  determine resting energy expenditure in a fasting state.  This will allow [place] to create a reduced calorie, high-protein meal plan to promote loss of fat mass while preserving muscle mass. Counseled on the health benefits of losing 5%-15% of total body weight. Was counseled on nutritional approaches to weight loss and  benefits of reducing processed foods and consuming plant-based foods and high quality protein as part of nutritional weight management. Was counseled on pharmacotherapy and role as an adjunct in weight management.   Obesity Education Performed Today:  She was weighed on the bioimpedance scale and results were discussed and documented in the synopsis.  We discussed obesity as a disease and the importance of a more detailed evaluation of all the factors contributing to the disease.  We discussed the importance of long term lifestyle changes which include nutrition, exercise and behavioral modifications as well as the importance of customizing this to her specific health and social needs.  We discussed the benefits of reaching a healthier weight to alleviate the symptoms of existing conditions and reduce the risks of the biomechanical, metabolic and psychological effects of obesity.  Ave Filter appears to be in the action stage of change and states they are ready to start intensive lifestyle modifications and behavioral modifications.  30 minutes was spent today on this visit including the above counseling, pre-visit chart review, and post-visit documentation.  Reviewed by clinician on day of visit: allergies, medications, problem list, medical history, surgical history, family history, social history, and previous encounter notes pertinent to obesity diagnosis.   Jaun Galluzzo d. Eiza Canniff, NP-C

## 2023-04-04 ENCOUNTER — Ambulatory Visit (INDEPENDENT_AMBULATORY_CARE_PROVIDER_SITE_OTHER): Payer: 59 | Admitting: Family Medicine

## 2023-04-04 ENCOUNTER — Encounter (INDEPENDENT_AMBULATORY_CARE_PROVIDER_SITE_OTHER): Payer: Self-pay | Admitting: Family Medicine

## 2023-04-04 VITALS — BP 122/85 | HR 82 | Temp 98.2°F | Ht 62.0 in | Wt 330.0 lb

## 2023-04-04 DIAGNOSIS — F32A Depression, unspecified: Secondary | ICD-10-CM | POA: Diagnosis not present

## 2023-04-04 DIAGNOSIS — E538 Deficiency of other specified B group vitamins: Secondary | ICD-10-CM

## 2023-04-04 DIAGNOSIS — M79672 Pain in left foot: Secondary | ICD-10-CM | POA: Diagnosis not present

## 2023-04-04 DIAGNOSIS — Z6841 Body Mass Index (BMI) 40.0 and over, adult: Secondary | ICD-10-CM | POA: Diagnosis not present

## 2023-04-04 DIAGNOSIS — R7303 Prediabetes: Secondary | ICD-10-CM | POA: Diagnosis not present

## 2023-04-04 DIAGNOSIS — E559 Vitamin D deficiency, unspecified: Secondary | ICD-10-CM | POA: Insufficient documentation

## 2023-04-04 DIAGNOSIS — R0602 Shortness of breath: Secondary | ICD-10-CM | POA: Diagnosis not present

## 2023-04-04 DIAGNOSIS — R5383 Other fatigue: Secondary | ICD-10-CM | POA: Insufficient documentation

## 2023-04-04 DIAGNOSIS — Z1331 Encounter for screening for depression: Secondary | ICD-10-CM

## 2023-04-04 DIAGNOSIS — G4730 Sleep apnea, unspecified: Secondary | ICD-10-CM | POA: Diagnosis not present

## 2023-04-04 NOTE — Progress Notes (Signed)
Chief Complaint:   OBESITY Renee Brennan (MR# [id number]) is a 52 y.o. female who presents for evaluation and treatment of obesity and related comorbidities. Current BMI is Body mass index is 60.36 kg/m. Renee Brennan has been struggling with her weight for many years and has been unsuccessful in either losing weight, maintaining weight loss, or reaching her healthy weight goal.  Renee Brennan is currently in the action stage of change and ready to dedicate time achieving and maintaining a healthier weight. Renee Brennan is interested in becoming our patient and working on intensive lifestyle modifications including (but not limited to) diet and exercise for weight loss.  Patient works nights and security; for 2 years.  Divorced with 2 kids and her 31-year-old granddaughter.  Would like to get 270 pounds.  Overweight since childhood.  Gained weight following foot surgery 3 years ago.  Movie popcorn is a pleasure as well as ice cream.  Renee Brennan's habits were reviewed today and are as follows: Her family eats meals together, she thinks her family will eat healthier with her, her desired weight loss is 160 lbs, she has been heavy most of her life, she started gaining weight after surgery, her heaviest weight ever was 347 pounds, she has significant food cravings issues, she snacks frequently in the evenings, she skips meals frequently, she is trying to follow a vegetarian diet, she is frequently drinking liquids with calories, and she struggles with emotional eating.  Depression Screen Renee Brennan's Food and Mood (modified PHQ-9) score was 6.  Subjective:   1. Other fatigue Renee Brennan admits to daytime somnolence and admits to waking up still tired. Patient has a history of symptoms of daytime fatigue, morning fatigue, and morning headache. Renee Brennan generally gets 5 hours of sleep per night, and states that she has nightime awakenings. Snoring is present. Apneic episodes are not present. Epworth  Sleepiness Score is 1.  EKG normal sinus rhythm without ischemic changes.  2. SOBOE (shortness of breath on exertion) Renee Brennan notes increasing shortness of breath with exercising and seems to be worsening over time with weight gain. She notes getting out of breath sooner with activity than she used to. This has not gotten worse recently. Renee Brennan denies shortness of breath at rest or orthopnea.  3. Pre-diabetes Patient has never been on metformin, and she craves sweets.  Last A1c was 5.9 on 12/12/2022.  No family history of type 2 diabetes mellitus.  Personal history of gestational diabetes mellitus.  4. Vitamin D deficiency Patient has been taking prescription vitamin D 50,000 IU weekly for 2 years.  Energy level has improved.  5. Sleep-disordered breathing Patient is awaiting home sleep study with [facility].  Getting 4 to 5 hours of sleep.  6. Left foot pain Patient reports a history of left foot surgery in 2021 that limits walking; causes some pain and swelling.  7. B12 deficiency Patient's last B12 was 248 on 12/12/2022.  She is taking oral B12 2500 mcg once weekly.  Denies paresthesias, but notes fatigue.  Does not eat red meat.  Assessment/Plan:   1. Other fatigue Renee Brennan does feel that her weight is causing her energy to be lower than it should be. Fatigue may be related to obesity, depression or many other causes. Labs will be ordered, and in the meanwhile, Renee Brennan will focus on self care including making healthy food choices, increasing physical activity and focusing on stress reduction.  - EKG 12-Lead - VITAMIN D 25 Hydroxy (Vit-D Deficiency, Fractures) - Insulin, random - Hemoglobin  A1c - Folate - Comprehensive metabolic panel - Vitamin B12 - CBC with Differential/Platelet  2. SOBOE (shortness of breath on exertion) Renee Brennan does feel that she gets out of breath more easily that she used to when she exercises. Renee Brennan's shortness of breath appears to be obesity  related and exercise induced. She has agreed to work on weight loss and gradually increase exercise to treat her exercise induced shortness of breath. Will continue to monitor closely.  3. Pre-diabetes We will check labs today; patient will reduce her sugar intake.  - Hemoglobin A1c - Insulin, random  4. Vitamin D deficiency We will check labs today, and we will follow-up at patient's next office visit.  - VITAMIN D 25 Hydroxy (Vit-D Deficiency, Fractures)  5. Sleep-disordered breathing Follow-up with results of HST.  6. Left foot pain We discussed yoga, weights, and swimming options.  7. B12 deficiency We will check labs today, and we will follow-up at patient's next office visit.  - Vitamin B12  8. Depression screening Renee Brennan had a positive depression screening. Depression is commonly associated with obesity and often results in emotional eating behaviors. We will monitor this closely and work on CBT to help improve the non-hunger eating patterns. Referral to Psychology may be required if no improvement is seen as she continues in our clinic.  9. BMI 60.0-69.9, adult (HCC)  10. Morbid obesity starting BMI 60.4 Renee Brennan is currently in the action stage of change and her goal is to continue with weight loss efforts. I recommend Renee Brennan begin the structured treatment plan as follows:  She has agreed to the Category 3 Plan.  200 calorie snack list was given, and Eating Out guide.   Exercise goals: All adults should avoid inactivity. Some physical activity is better than none, and adults who participate in any amount of physical activity gain some health benefits.   Behavioral modification strategies: increasing lean protein intake, increasing vegetables, increasing water intake, decreasing liquid calories, increasing high fiber foods, decreasing eating out, no skipping meals, meal planning and cooking strategies, keeping healthy foods in the home, better snacking choices,  and avoiding temptations.  She was informed of the importance of frequent follow-up visits to maximize her success with intensive lifestyle modifications for her multiple health conditions. She was informed we would discuss her lab results at her next visit unless there is a critical issue that needs to be addressed sooner. Renee Brennan agreed to keep her next visit at the agreed upon time to discuss these results.  Objective:   Blood pressure 122/85, pulse 82, temperature 98.2 F (36.8 C), height 5\' 2"  (1.575 m), weight (!) 330 lb (149.7 kg), SpO2 96 %. Body mass index is 60.36 kg/m.  EKG: Normal sinus rhythm, rate 75 BPM.  Indirect Calorimeter completed today shows a VO2 of 240 and a REE of 1656.  Her calculated basal metabolic rate is 9629 thus her basal metabolic rate is worse than expected.  General: Cooperative, alert, well developed, in no acute distress. HEENT: Conjunctivae and lids unremarkable. Cardiovascular: Regular rhythm.  Lungs: Normal work of breathing. Neurologic: No focal deficits.   Lab Results  Component Value Date   CREATININE 0.81 12/03/2019   BUN 16 12/03/2019   NA 139 12/03/2019   K 3.7 12/03/2019   CL 101 12/03/2019   CO2 26 12/03/2019   No results found for: "ALT", "AST", "GGT", "ALKPHOS", "BILITOT" No results found for: "HGBA1C" No results found for: "INSULIN" No results found for: "TSH" No results found for: "CHOL", "HDL", "  LDLCALC", "LDLDIRECT", "TRIG", "CHOLHDL" No results found for: "WBC", "HGB", "HCT", "MCV", "PLT" No results found for: "IRON", "TIBC", "FERRITIN"  Attestation Statements:   Reviewed by clinician on day of visit: allergies, medications, problem list, medical history, surgical history, family history, social history, and previous encounter notes.  Time spent on visit including pre-visit chart review and post-visit charting and care was 45 minutes.   Trude Mcburney, am acting as transcriptionist for Seymour Bars, DO.  I have  reviewed the above documentation for accuracy and completeness, and I agree with the above. Seymour Bars, DO

## 2023-04-05 LAB — COMPREHENSIVE METABOLIC PANEL
ALT: 14 IU/L (ref 0–32)
AST: 15 IU/L (ref 0–40)
Albumin: 3.9 g/dL (ref 3.8–4.9)
Alkaline Phosphatase: 75 IU/L (ref 44–121)
BUN/Creatinine Ratio: 16 (ref 9–23)
BUN: 15 mg/dL (ref 6–24)
Bilirubin Total: 0.8 mg/dL (ref 0.0–1.2)
CO2: 24 mmol/L (ref 20–29)
Calcium: 9.1 mg/dL (ref 8.7–10.2)
Chloride: 103 mmol/L (ref 96–106)
Creatinine, Ser: 0.94 mg/dL (ref 0.57–1.00)
Globulin, Total: 3.1 g/dL (ref 1.5–4.5)
Glucose: 88 mg/dL (ref 70–99)
Potassium: 3.9 mmol/L (ref 3.5–5.2)
Sodium: 141 mmol/L (ref 134–144)
Total Protein: 7 g/dL (ref 6.0–8.5)
eGFR: 73 mL/min/{1.73_m2} (ref >59)

## 2023-04-05 LAB — CBC WITH DIFFERENTIAL/PLATELET
Basophils Absolute: 0.1 10*3/uL (ref 0.0–0.2)
Basos: 1 %
EOS (ABSOLUTE): 0.2 10*3/uL (ref 0.0–0.4)
Eos: 3 %
Hematocrit: 37.7 % (ref 34.0–46.6)
Hemoglobin: 12 g/dL (ref 11.1–15.9)
Immature Grans (Abs): 0 10*3/uL (ref 0.0–0.1)
Immature Granulocytes: 0 %
Lymphocytes Absolute: 2.1 10*3/uL (ref 0.7–3.1)
Lymphs: 29 %
MCH: 28.1 pg (ref 26.6–33.0)
MCHC: 31.8 g/dL (ref 31.5–35.7)
MCV: 88 fL (ref 79–97)
Monocytes Absolute: 0.5 10*3/uL (ref 0.1–0.9)
Monocytes: 7 %
Neutrophils Absolute: 4.2 10*3/uL (ref 1.4–7.0)
Neutrophils: 60 %
Platelets: 300 10*3/uL (ref 150–450)
RBC: 4.27 x10E6/uL (ref 3.77–5.28)
RDW: 13 % (ref 11.7–15.4)
WBC: 7 10*3/uL (ref 3.4–10.8)

## 2023-04-05 LAB — VITAMIN B12: Vitamin B-12: >2000 pg/mL — ABNORMAL HIGH (ref 232–1245)

## 2023-04-05 LAB — FOLATE: Folate: 7.2 ng/mL (ref >3.0)

## 2023-04-05 LAB — HEMOGLOBIN A1C
Est. average glucose Bld gHb Est-mCnc: 123 mg/dL
Hgb A1c MFr Bld: 5.9 % — ABNORMAL HIGH (ref 4.8–5.6)

## 2023-04-05 LAB — VITAMIN D 25 HYDROXY (VIT D DEFICIENCY, FRACTURES): Vit D, 25-Hydroxy: 31.4 ng/mL (ref 30.0–100.0)

## 2023-04-05 LAB — INSULIN, RANDOM: INSULIN: 10.4 u[IU]/mL (ref 2.6–24.9)

## 2023-04-18 ENCOUNTER — Ambulatory Visit (INDEPENDENT_AMBULATORY_CARE_PROVIDER_SITE_OTHER): Payer: 59 | Admitting: Family Medicine

## 2023-04-18 ENCOUNTER — Encounter (INDEPENDENT_AMBULATORY_CARE_PROVIDER_SITE_OTHER): Payer: Self-pay | Admitting: Family Medicine

## 2023-04-18 VITALS — BP 102/73 | HR 87 | Temp 97.8°F | Ht 62.0 in | Wt 320.0 lb

## 2023-04-18 DIAGNOSIS — E538 Deficiency of other specified B group vitamins: Secondary | ICD-10-CM

## 2023-04-18 DIAGNOSIS — R7303 Prediabetes: Secondary | ICD-10-CM

## 2023-04-18 DIAGNOSIS — Z6841 Body Mass Index (BMI) 40.0 and over, adult: Secondary | ICD-10-CM | POA: Diagnosis not present

## 2023-04-18 DIAGNOSIS — E559 Vitamin D deficiency, unspecified: Secondary | ICD-10-CM | POA: Diagnosis not present

## 2023-04-18 NOTE — Progress Notes (Signed)
Office: [phone]  /  Fax: [phone]  WEIGHT SUMMARY AND BIOMETRICS  Starting Date: 04/04/23  Starting Weight: 330lb   Weight Lost Since Last Visit: 10lb   Vitals Temp: 97.8 F (36.6 C) BP: 102/73 Pulse Rate: 87 SpO2: 98 %   Body Composition  Body Fat %: 58.3 % Fat Mass (lbs): 187 lbs Muscle Mass (lbs): 126.8 lbs Visceral Fat Rating : 25    HPI  Chief Complaint: OBESITY  Renee Brennan is here to discuss her progress with her obesity treatment plan. She is on the the Category 3 Plan and states she is following her eating plan approximately 90 % of the time. She states she is walking more at work and climbing stairs.    Interval History:  Since last office visit she is down 10 lb She is getting in all of her meals  and feels adequately full She has cut back on movie popcorn and ice cream She has gained muscle and lost body fat Cravings have reduced She brings food to work and is currently living out of a hotel until mid Aug She is getting some walking in   Pharmacotherapy: none  PHYSICAL EXAM:  Blood pressure 102/73, pulse 87, temperature 97.8 F (36.6 C), height 5\' 2"  (1.575 m), weight (!) 320 lb (145.2 kg), SpO2 98%. Body mass index is 58.53 kg/m.  General: She is overweight, cooperative, alert, well developed, and in no acute distress. PSYCH: Has normal mood, affect and thought process.   Lungs: Normal breathing effort, no conversational dyspnea.   ASSESSMENT AND PLAN  TREATMENT PLAN FOR OBESITY:  Recommended Dietary Goals  Amberia is currently in the action stage of change. As such, her goal is to continue weight management plan. She has agreed to the Category 3 Plan.  Behavioral Intervention  We discussed the following Behavioral Modification Strategies today: increasing lean protein intake, decreasing simple carbohydrates , increasing vegetables, increasing lower glycemic fruits, increasing fiber rich foods, increasing water intake, work  on meal planning and preparation, continue to practice mindfulness when eating, and planning for success.  Additional resources provided today: NA  Recommended Physical Activity Goals  Annaleia has been advised to work up to 150 minutes of moderate intensity aerobic activity a week and strengthening exercises 2-3 times per week for cardiovascular health, weight loss maintenance and preservation of muscle mass.   She has agreed to Increase the intensity, frequency or duration of aerobic exercises    Pharmacotherapy changes for the treatment of obesity: none  ASSOCIATED CONDITIONS ADDRESSED TODAY  Pre-diabetes Assessment & Plan: Lab Results  Component Value Date   HGBA1C 5.9 (H) 04/04/2023   Reviewed lab from last visit She has never used metformin She is at risk for T2DM with BMI >50 She has started prescribed meal plan with weight reduction.   Morbid obesity starting BMI 60.4  BMI 50.0-59.9, adult (HCC)  Vitamin D deficiency Assessment & Plan: Last vitamin D Lab Results  Component Value Date   VD25OH 31.4 04/04/2023   Reviewed lab from last visit Taking RX vitamin D weekly without problems  Continue RX vitamin D weekly.  Aim for 50-70 range.   B12 deficiency Assessment & Plan: Reviewed lab.  B12 was high.  She denies feeling poorly or problems with paresthesias or memory loss.  She reports taking vitamin B12 2,500 mcg daily Reduce dose to every other day and have her bring in bottle next visit to verify dose.       She was informed of the  importance of frequent follow up visits to maximize her success with intensive lifestyle modifications for her multiple health conditions.   ATTESTASTION STATEMENTS:  Reviewed by clinician on day of visit: allergies, medications, problem list, medical history, surgical history, family history, social history, and previous encounter notes pertinent to obesity diagnosis.   I have personally spent 30 minutes total time  today in preparation, patient care, nutritional counseling and documentation for this visit, including the following: review of clinical lab tests; review of medical tests/procedures/services.      [name], DO DABFM, DABOM [facility] [address] [place], [place] [address] [phone]

## 2023-04-18 NOTE — Assessment & Plan Note (Signed)
Lab Results  Component Value Date   HGBA1C 5.9 (H) 04/04/2023   Reviewed lab from last visit She has never used metformin She is at risk for T2DM with BMI >50 She has started prescribed meal plan with weight reduction.

## 2023-04-18 NOTE — Assessment & Plan Note (Signed)
Last vitamin D Lab Results  Component Value Date   VD25OH 31.4 04/04/2023   Reviewed lab from last visit Taking RX vitamin D weekly without problems  Continue RX vitamin D weekly.  Aim for 50-70 range.

## 2023-04-18 NOTE — Assessment & Plan Note (Signed)
Reviewed lab.  B12 was high.  She denies feeling poorly or problems with paresthesias or memory loss.  She reports taking vitamin B12 2,500 mcg daily Reduce dose to every other day and have her bring in bottle next visit to verify dose.

## 2023-05-15 ENCOUNTER — Ambulatory Visit (INDEPENDENT_AMBULATORY_CARE_PROVIDER_SITE_OTHER): Payer: 59 | Admitting: Family Medicine

## 2023-05-15 ENCOUNTER — Encounter (INDEPENDENT_AMBULATORY_CARE_PROVIDER_SITE_OTHER): Payer: Self-pay | Admitting: Family Medicine

## 2023-05-15 VITALS — BP 107/74 | HR 85 | Temp 98.5°F | Ht 62.0 in | Wt 314.0 lb

## 2023-05-15 DIAGNOSIS — E538 Deficiency of other specified B group vitamins: Secondary | ICD-10-CM

## 2023-05-15 DIAGNOSIS — R7303 Prediabetes: Secondary | ICD-10-CM

## 2023-05-15 DIAGNOSIS — G4730 Sleep apnea, unspecified: Secondary | ICD-10-CM | POA: Diagnosis not present

## 2023-05-15 DIAGNOSIS — E559 Vitamin D deficiency, unspecified: Secondary | ICD-10-CM | POA: Diagnosis not present

## 2023-05-15 DIAGNOSIS — Z6841 Body Mass Index (BMI) 40.0 and over, adult: Secondary | ICD-10-CM

## 2023-05-15 NOTE — Assessment & Plan Note (Signed)
She has held off on a sleep study.  She is awaiting moved from a long-term hotel to a house this weekend.  Her snoring has reduced.  She denies daytime somnolence.  Will revisit options for sleep study if needed in the coming month.

## 2023-05-15 NOTE — Assessment & Plan Note (Signed)
She has been taking an OTC vitamin B12 supplement, 2500 mcg daily and her last B12 level was HIGH.  She moved it to every other day as we didn't know how much she was taken.  She has improved energy levels.  Move vitamin B12 2500 mcg to 2 x a week and recheck level in 2 mos

## 2023-05-15 NOTE — Progress Notes (Signed)
Office: [phone]  /  Fax: [phone]  WEIGHT SUMMARY AND BIOMETRICS  Starting Date: 04/04/23  Starting Weight: 330lb   Weight Lost Since Last Visit: 6lb   Vitals Temp: 98.5 F (36.9 C) BP: 107/74 Pulse Rate: 85 SpO2: 96 %   Body Composition  Body Fat %: 59.9 % Fat Mass (lbs): 188 lbs Muscle Mass (lbs): 119.6 lbs Visceral Fat Rating : 25   HPI  Chief Complaint: OBESITY  Renee Brennan is here to discuss her progress with her obesity treatment plan. She is on the the Category 3 Plan and states she is following her eating plan approximately 75 % of the time. She states she is exercising 30 minutes 4 times per week.   Interval History:  Since last office visit she is down 6 lb She is down 16 lb in the past 6 wks of medically supervised weight management She enjoys movie theatre popcorn just not as often and shares it with her son now She is keeping ice cream out of the house and will be moving from a hotel to her home this weekend She is working on hydrating well She avoids SSBs Hunger and cravings are under good control and she is working to slow eating pace She held off on sleep study and is not snoring as much  Pharmacotherapy: none  PHYSICAL EXAM:  Blood pressure 107/74, pulse 85, temperature 98.5 F (36.9 C), height 5\' 2"  (1.575 m), weight (!) 314 lb (142.4 kg), SpO2 96%. Body mass index is 57.43 kg/m.  General: She is overweight, cooperative, alert, well developed, and in no acute distress. PSYCH: Has normal mood, affect and thought process.   Lungs: Normal breathing effort, no conversational dyspnea.   ASSESSMENT AND PLAN  TREATMENT PLAN FOR OBESITY:  Recommended Dietary Goals  Renee Brennan is currently in the action stage of change. As such, her goal is to continue weight management plan. She has agreed to the Category 3 Plan.  Behavioral Intervention  We discussed the following Behavioral Modification Strategies today: increasing lean protein  intake, decreasing simple carbohydrates , increasing vegetables, increasing lower glycemic fruits, increasing water intake, work on meal planning and preparation, keeping healthy foods at home, continue to work on implementation of reduced calorie nutritional plan, continue to practice mindfulness when eating, and planning for success.  Additional resources provided today: NA  Recommended Physical Activity Goals  Renee Brennan has been advised to work up to 150 minutes of moderate intensity aerobic activity a week and strengthening exercises 2-3 times per week for cardiovascular health, weight loss maintenance and preservation of muscle mass.   She has agreed to Think about ways to increase daily physical activity and overcoming barriers to exercise - plans to walk in her new neighborhood and is going to the gym 1 x a week  Pharmacotherapy changes for the treatment of obesity: none  ASSOCIATED CONDITIONS ADDRESSED TODAY  Vitamin D deficiency Assessment & Plan: Last vitamin D Lab Results  Component Value Date   VD25OH 31.4 04/04/2023    Taking RX vitamin D weekly  Energy levels are improving Recheck levels in 3 mos   B12 deficiency Assessment & Plan: She has been taking an OTC vitamin B12 supplement, 2500 mcg daily and her last B12 level was HIGH.  She moved it to every other day as we didn't know how much she was taken.  She has improved energy levels.  Move vitamin B12 2500 mcg to 2 x a week and recheck level in 2 mos  Pre-diabetes Assessment & Plan: Lab Results  Component Value Date   HGBA1C 5.9 (H) 04/04/2023   She is not on medication for the treatment of prediabetes.  She is actively working on weight reduction.  She has reduced her intake of starches and sweets.  She plans to increase her exercise frequency.  Continue healthy lifestyle changes and recheck A1c in the next 3 months.   Morbid obesity starting BMI 60.4  BMI 50.0-59.9, adult (HCC)  Sleep-disordered  breathing Assessment & Plan: She has held off on a sleep study.  She is awaiting moved from a long-term hotel to a house this weekend.  Her snoring has reduced.  She denies daytime somnolence.  Will revisit options for sleep study if needed in the coming month.       She was informed of the importance of frequent follow up visits to maximize her success with intensive lifestyle modifications for her multiple health conditions.   ATTESTASTION STATEMENTS:  Reviewed by clinician on day of visit: allergies, medications, problem list, medical history, surgical history, family history, social history, and previous encounter notes pertinent to obesity diagnosis.   I have personally spent 30 minutes total time today in preparation, patient care, nutritional counseling and documentation for this visit, including the following: review of clinical lab tests; review of medical tests/procedures/services.      [name], DO DABFM, DABOM [facility] [address] [place], [place] [address] [phone]

## 2023-05-15 NOTE — Assessment & Plan Note (Addendum)
Lab Results  Component Value Date   HGBA1C 5.9 (H) 04/04/2023   She is not on medication for the treatment of prediabetes.  She is actively working on weight reduction.  She has reduced her intake of starches and sweets.  She plans to increase her exercise frequency.  Continue healthy lifestyle changes and recheck A1c in the next 3 months.

## 2023-05-15 NOTE — Assessment & Plan Note (Signed)
Last vitamin D Lab Results  Component Value Date   VD25OH 31.4 04/04/2023    Taking RX vitamin D weekly  Energy levels are improving Recheck levels in 3 mos

## 2023-05-29 DIAGNOSIS — Z6841 Body Mass Index (BMI) 40.0 and over, adult: Secondary | ICD-10-CM | POA: Diagnosis not present

## 2023-05-29 DIAGNOSIS — E538 Deficiency of other specified B group vitamins: Secondary | ICD-10-CM | POA: Diagnosis not present

## 2023-05-29 DIAGNOSIS — G4733 Obstructive sleep apnea (adult) (pediatric): Secondary | ICD-10-CM | POA: Diagnosis not present

## 2023-05-29 DIAGNOSIS — R5382 Chronic fatigue, unspecified: Secondary | ICD-10-CM | POA: Diagnosis not present

## 2023-05-29 DIAGNOSIS — E559 Vitamin D deficiency, unspecified: Secondary | ICD-10-CM | POA: Diagnosis not present

## 2023-05-29 DIAGNOSIS — R7303 Prediabetes: Secondary | ICD-10-CM | POA: Diagnosis not present

## 2023-06-12 ENCOUNTER — Encounter (INDEPENDENT_AMBULATORY_CARE_PROVIDER_SITE_OTHER): Payer: Self-pay | Admitting: Internal Medicine

## 2023-06-12 ENCOUNTER — Ambulatory Visit (INDEPENDENT_AMBULATORY_CARE_PROVIDER_SITE_OTHER): Payer: 59 | Admitting: Internal Medicine

## 2023-06-12 VITALS — BP 104/71 | HR 75 | Temp 98.2°F | Ht 62.0 in | Wt 307.0 lb

## 2023-06-12 DIAGNOSIS — Z6841 Body Mass Index (BMI) 40.0 and over, adult: Secondary | ICD-10-CM

## 2023-06-12 DIAGNOSIS — G4720 Circadian rhythm sleep disorder, unspecified type: Secondary | ICD-10-CM | POA: Diagnosis not present

## 2023-06-12 DIAGNOSIS — R7303 Prediabetes: Secondary | ICD-10-CM

## 2023-06-12 DIAGNOSIS — G4730 Sleep apnea, unspecified: Secondary | ICD-10-CM

## 2023-06-12 NOTE — Assessment & Plan Note (Signed)
According to records it was suspected that she may have sleep apnea.  I do not think she has had a sleep study.  We will further assess at the next office visit.

## 2023-06-12 NOTE — Assessment & Plan Note (Signed)
Most recent A1c is  Lab Results  Component Value Date   HGBA1C 5.9 (H) 04/04/2023    Patient aware of disease state and risk of progression. This may contribute to abnormal cravings, fatigue and diabetic complications without having diabetes.   We have discussed treatment options which include: losing 7 to 10% of body weight, increasing physical activity to a goal of 150 minutes a week at moderate intensity.  Advised to maintain a diet low on simple and processed carbohydrates.  She may also be a candidate for pharmacoprophylaxis with metformin or incretin mimetic.

## 2023-06-12 NOTE — Assessment & Plan Note (Signed)
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  This may affect hormonal balance particularly around orixegenic hormones.  Patient will be screened for sleep apnea.  We discussed avoidance of eating large meals before going to bed.  She will work on distributing calories throughout the day.

## 2023-06-12 NOTE — Assessment & Plan Note (Signed)
Peak weight is 347, starting weight 330.  She has lost 25 pounds or 8% of her starting weight.  BIA suggest a reduction in body fat but also loss of muscle mass higher than 20%.  This may be the end result of mobilization of glycogen and water due to calorie restriction which is usually associated with rapid weight loss.  We discussed strategies to prevent muscle loss including increasing protein intake to 30 to 40 g per meal and increasing strengthening exercises.

## 2023-06-12 NOTE — Progress Notes (Signed)
Office: [phone]  /  Fax: [phone]  WEIGHT SUMMARY AND BIOMETRICS  Vitals Temp: 98.2 F (36.8 C) BP: 104/71 Pulse Rate: 75 SpO2: 99 %   Anthropometric Measurements Height: 5\' 2"  (1.575 m) Weight: (!) 307 lb (139.3 kg) BMI (Calculated): 56.14 Weight at Last Visit: 314 lb Weight Lost Since Last Visit: 7 lb Weight Gained Since Last Visit: 0 lb Starting Weight: 330 lb Total Weight Loss (lbs): 23 lb (10.4 kg) Peak Weight: 347 lb   Body Composition  Body Fat %: 59.5 % Fat Mass (lbs): 183 lbs Muscle Mass (lbs): 118.4 lbs Visceral Fat Rating : 25    RMR: 1656  Today's Visit #: 4  Starting Date: 04/04/23   HPI  Chief Complaint: OBESITY  Renee Brennan is here to discuss her progress with her obesity treatment plan. She is on the the Category 3 Plan and states she is following her eating plan approximately 60 % of the time. She states she is doing a lot of walking.  Interval History:  Since last office visit she has lost 7 pounds. She reports fair adherence to reduced calorie nutritional plan. She has been working on not skipping meals, increasing protein intake at every meal, and eating more vegetables  Orexigenic Control: Denies problems with appetite and hunger signals.  Denies problems with satiety and satiation.  Denies problems with eating patterns and portion control.  Denies abnormal cravings. Denies feeling deprived or restricted.   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   Pharmacotherapy for weight loss: She is currently taking no anti-obesity medication.    ASSESSMENT AND PLAN  TREATMENT PLAN FOR OBESITY:  Recommended Dietary Goals  Renee Brennan is currently in the action stage of change. As such, her goal is to continue weight management plan. She has agreed to: continue current plan and add protein sake for last meal of the day for convenience.  Behavioral Intervention  We discussed the following Behavioral  Modification Strategies today: increasing lean protein intake, decreasing simple carbohydrates , increasing vegetables, increasing lower glycemic fruits, increasing fiber rich foods, avoiding skipping meals, increasing water intake, work on meal planning and preparation, and planning for success.  Additional resources provided today: Handout on popular protein drinks   Recommended Physical Activity Goals  Renee Brennan has been advised to work up to 150 minutes of moderate intensity aerobic activity a week and strengthening exercises 2-3 times per week for cardiovascular health, weight loss maintenance and preservation of muscle mass.   She has agreed to :  Start strengthening exercises with a goal of 2-3 sessions a week   Pharmacotherapy We discussed various medication options to help [place] with her weight loss efforts and we both agreed to : continue with nutritional and behavioral strategies  ASSOCIATED CONDITIONS ADDRESSED TODAY  Pre-diabetes Assessment & Plan: Most recent A1c is  Lab Results  Component Value Date   HGBA1C 5.9 (H) 04/04/2023    Patient aware of disease state and risk of progression. This may contribute to abnormal cravings, fatigue and diabetic complications without having diabetes.   We have discussed treatment options which include: losing 7 to 10% of body weight, increasing physical activity to a goal of 150 minutes a week at moderate intensity.  Advised to maintain a diet low on simple and processed carbohydrates.  She may also be a candidate for pharmacoprophylaxis with metformin or incretin mimetic.     Morbid obesity starting BMI 60.4 Assessment & Plan: Peak weight is 347, starting weight 330.  She  has lost 25 pounds or 8% of her starting weight.  BIA suggest a reduction in body fat but also loss of muscle mass higher than 20%.  This may be the end result of mobilization of glycogen and water due to calorie restriction which is usually associated with  rapid weight loss.  We discussed strategies to prevent muscle loss including increasing protein intake to 30 to 40 g per meal and increasing strengthening exercises.   Sleep-disordered breathing Assessment & Plan: According to records it was suspected that she may have sleep apnea.  I do not think she has had a sleep study.  We will further assess at the next office visit.   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  This may affect hormonal balance particularly around orixegenic hormones.  Patient will be screened for sleep apnea.  We discussed avoidance of eating large meals before going to bed.  She will work on distributing calories throughout the day.     PHYSICAL EXAM:  Blood pressure 104/71, pulse 75, temperature 98.2 F (36.8 C), height 5\' 2"  (1.575 m), weight (!) 307 lb (139.3 kg), SpO2 99%. Body mass index is 56.15 kg/m.  General: She is overweight, cooperative, alert, well developed, and in no acute distress. PSYCH: Has normal mood, affect and thought process.   HEENT: EOMI, sclerae are anicteric. Lungs: Normal breathing effort, no conversational dyspnea. Extremities: No edema.  Neurologic: No gross sensory or motor deficits. No tremors or fasciculations noted.    DIAGNOSTIC DATA REVIEWED:  BMET    Component Value Date/Time   NA 141 04/04/2023 1049   K 3.9 04/04/2023 1049   CL 103 04/04/2023 1049   CO2 24 04/04/2023 1049   GLUCOSE 88 04/04/2023 1049   GLUCOSE 84 12/03/2019 1006   BUN 15 04/04/2023 1049   CREATININE 0.94 04/04/2023 1049   CALCIUM 9.1 04/04/2023 1049   GFRNONAA >60 12/03/2019 1006   GFRAA >60 12/03/2019 1006   Lab Results  Component Value Date   HGBA1C 5.9 (H) 04/04/2023   Lab Results  Component Value Date   INSULIN 10.4 04/04/2023   No results found for: "TSH" CBC    Component Value Date/Time   WBC 7.0 04/04/2023 1049   RBC 4.27  04/04/2023 1049   HGB 12.0 04/04/2023 1049   HCT 37.7 04/04/2023 1049   PLT 300 04/04/2023 1049   MCV 88 04/04/2023 1049   MCH 28.1 04/04/2023 1049   MCHC 31.8 04/04/2023 1049   RDW 13.0 04/04/2023 1049   Iron Studies No results found for: "IRON", "TIBC", "FERRITIN", "IRONPCTSAT" Lipid Panel  No results found for: "CHOL", "TRIG", "HDL", "CHOLHDL", "VLDL", "LDLCALC", "LDLDIRECT" Hepatic Function Panel     Component Value Date/Time   PROT 7.0 04/04/2023 1049   ALBUMIN 3.9 04/04/2023 1049   AST 15 04/04/2023 1049   ALT 14 04/04/2023 1049   ALKPHOS 75 04/04/2023 1049   BILITOT 0.8 04/04/2023 1049   No results found for: "TSH" Nutritional Lab Results  Component Value Date   VD25OH 31.4 04/04/2023     Return in about 3 weeks (around 07/03/2023) for For Weight Mangement with Dr. Rikki Spearing.Marland Kitchen She was informed of the importance of frequent follow up visits to maximize her success with intensive lifestyle modifications for her multiple health conditions.   ATTESTASTION STATEMENTS:  Reviewed by clinician on day of visit: allergies, medications, problem list, medical history, surgical history, family  history, social history, and previous encounter notes.     Worthy Rancher, MD

## 2023-07-03 ENCOUNTER — Encounter (INDEPENDENT_AMBULATORY_CARE_PROVIDER_SITE_OTHER): Payer: Self-pay | Admitting: Family Medicine

## 2023-07-03 ENCOUNTER — Ambulatory Visit (INDEPENDENT_AMBULATORY_CARE_PROVIDER_SITE_OTHER): Payer: 59 | Admitting: Family Medicine

## 2023-07-03 VITALS — BP 106/71 | HR 60 | Temp 97.4°F | Ht 62.0 in | Wt 296.0 lb

## 2023-07-03 DIAGNOSIS — E559 Vitamin D deficiency, unspecified: Secondary | ICD-10-CM | POA: Diagnosis not present

## 2023-07-03 DIAGNOSIS — Z6841 Body Mass Index (BMI) 40.0 and over, adult: Secondary | ICD-10-CM | POA: Diagnosis not present

## 2023-07-03 MED ORDER — VITAMIN D (ERGOCALCIFEROL) 1.25 MG (50000 UNIT) PO CAPS: 50000.0000 [IU] | ORAL_CAPSULE | ORAL | 0 refills | Status: DC

## 2023-07-03 NOTE — Progress Notes (Signed)
     Chief Complaint:   OBESITY Renee Brennan is here to discuss her progress with her obesity treatment plan along with follow-up of her obesity related diagnoses. Renee Brennan is on the Category 3 Plan and states she is following her eating plan approximately 80% of the time. Renee Brennan states she is doing strength training and walking for 60 minutes 5 times per week.  Today's visit was #: 5 Starting weight: 330 lbs Starting date: 04/04/2023 Today's weight: 296 lbs Today's date: 07/03/2023 Total lbs lost to date: 34 Total lbs lost since last in-office visit: 11  Interim History: Patient continues to do very well with her weight loss.  Her hunger is mostly controlled.  She is mindful of her food choices, and her exercise is good.  Subjective:   1. Vitamin D deficiency Patient's last vitamin D level was not yet at goal, but she notes her fatigue is improving.  Assessment/Plan:   1. Vitamin D deficiency Patient will continue prescription vitamin D 50,000 IU once every 7 days, and we will refill for 1 month.  2. BMI 50.0-59.9, adult (HCC)  3. Morbid obesity starting BMI 60.4 Renee Brennan is currently in the action stage of change. As such, her goal is to continue with weight loss efforts. She has agreed to the Category 3 Plan.   Exercise goals: Strengthening exercises were discussed and demonstrated today.  Behavioral modification strategies: increasing lean protein intake.  Renee Brennan has agreed to follow-up with our clinic in 3 weeks. She was informed of the importance of frequent follow-up visits to maximize her success with intensive lifestyle modifications for her multiple health conditions.   Objective:   Blood pressure 106/71, pulse 60, temperature (!) 97.4 F (36.3 C), height 5\' 2"  (1.575 m), weight 296 lb (134.3 kg), SpO2 97%. Body mass index is 54.14 kg/m.  Lab Results  Component Value Date   CREATININE 0.94 04/04/2023   BUN 15 04/04/2023   NA 141 04/04/2023   K 3.9  04/04/2023   CL 103 04/04/2023   CO2 24 04/04/2023   Lab Results  Component Value Date   ALT 14 04/04/2023   AST 15 04/04/2023   ALKPHOS 75 04/04/2023   BILITOT 0.8 04/04/2023   Lab Results  Component Value Date   HGBA1C 5.9 (H) 04/04/2023   Lab Results  Component Value Date   INSULIN 10.4 04/04/2023   No results found for: "TSH" No results found for: "CHOL", "HDL", "LDLCALC", "LDLDIRECT", "TRIG", "CHOLHDL" Lab Results  Component Value Date   VD25OH 31.4 04/04/2023   Lab Results  Component Value Date   WBC 7.0 04/04/2023   HGB 12.0 04/04/2023   HCT 37.7 04/04/2023   MCV 88 04/04/2023   PLT 300 04/04/2023   No results found for: "IRON", "TIBC", "FERRITIN"  Attestation Statements:   Reviewed by clinician on day of visit: allergies, medications, problem list, medical history, surgical history, family history, social history, and previous encounter notes.   I, Burt Knack, am acting as transcriptionist for Quillian Quince, MD.  I have reviewed the above documentation for accuracy and completeness, and I agree with the above. -  Quillian Quince, MD

## 2023-07-25 ENCOUNTER — Encounter (INDEPENDENT_AMBULATORY_CARE_PROVIDER_SITE_OTHER): Payer: Self-pay | Admitting: Internal Medicine

## 2023-07-25 ENCOUNTER — Ambulatory Visit (INDEPENDENT_AMBULATORY_CARE_PROVIDER_SITE_OTHER): Payer: 59 | Admitting: Internal Medicine

## 2023-07-25 VITALS — BP 122/74 | HR 69 | Temp 98.2°F | Ht 62.0 in | Wt 294.0 lb

## 2023-07-25 DIAGNOSIS — Z6841 Body Mass Index (BMI) 40.0 and over, adult: Secondary | ICD-10-CM | POA: Diagnosis not present

## 2023-07-25 DIAGNOSIS — G4720 Circadian rhythm sleep disorder, unspecified type: Secondary | ICD-10-CM | POA: Diagnosis not present

## 2023-07-25 DIAGNOSIS — R29818 Other symptoms and signs involving the nervous system: Secondary | ICD-10-CM | POA: Insufficient documentation

## 2023-07-25 DIAGNOSIS — R7303 Prediabetes: Secondary | ICD-10-CM | POA: Diagnosis not present

## 2023-07-25 HISTORY — DX: Other symptoms and signs involving the nervous system: R29.818

## 2023-07-25 NOTE — Progress Notes (Signed)
Office: [phone]  /  Fax: [phone]  WEIGHT SUMMARY AND BIOMETRICS  Vitals Temp: 98.2 F (36.8 C) BP: 122/74 Pulse Rate: 69 SpO2: 97 %   Anthropometric Measurements Height: 5\' 2"  (1.575 m) Weight: 294 lb (133.4 kg) BMI (Calculated): 53.76 Weight at Last Visit: 296 lb Weight Lost Since Last Visit: 2 lb Weight Gained Since Last Visit: 0 lb Starting Weight: 330 lb Total Weight Loss (lbs): 36 lb (16.3 kg) Peak Weight: 347 lb   Body Composition  Body Fat %: 57.1 % Fat Mass (lbs): 167.8 lbs Muscle Mass (lbs): 119.8 lbs Total Body Water (lbs): 23 lbs    No data recorded Today's Visit #: 6  Starting Date: 04/04/23   HPI  Chief Complaint: OBESITY  Nannette is here to discuss her progress with her obesity treatment plan. She is on the the Category 3 Plan and states she is following her eating plan approximately 75 % of the time. She states she is exercising 60 minutes 5 times per week.  Interval History:  Since last office visit she has lost 2 pounds. She reports good adherence to reduced calorie nutritional plan. She has been working on not skipping meals, increasing protein intake at every meal, eating more vegetables, drinking more water, making healthier choices, continues to exercise, reducing portion sizes, and incorporating more whole foods  Orexigenic Control: Denies problems with appetite and hunger signals.  Denies problems with satiety and satiation.  Denies problems with eating patterns and portion control.  Denies abnormal cravings. Denies feeling deprived or restricted.   Barriers identified: none.   Pharmacotherapy for weight loss: She is currently taking no anti-obesity medication.    ASSESSMENT AND PLAN  TREATMENT PLAN FOR OBESITY:  Recommended Dietary Goals  Zaliya is currently in the action stage of change. As such, her goal is to continue weight management plan. She has agreed to: continue current plan  Behavioral  Intervention  We discussed the following Behavioral Modification Strategies today: continue to work on maintaining a reduced calorie state, getting the recommended amount of protein, incorporating whole foods, making healthy choices, staying well hydrated and practicing mindfulness when eating..  Additional resources provided today: Handout on symptoms of sleep apnea, health risk and its effect on weight management and Provided with personal guidance and instructions on how to use [employer] for healthy meal ideas and cooking in bulk.  Recommended Physical Activity Goals  Archana has been advised to work up to 150 minutes of moderate intensity aerobic activity a week and strengthening exercises 2-3 times per week for cardiovascular health, weight loss maintenance and preservation of muscle mass.   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  This may affect hormonal balance particularly around orixegenic hormones.       Morbid obesity starting BMI 60.4 Assessment & Plan: Peak weight is 347, starting weight 330.  She has lost 33 pounds or 13% of her starting weight.    Pre-diabetes Assessment & Plan: Most recent A1c is  Lab Results  Component Value Date   HGBA1C 5.9 (H) 04/04/2023   She has been maintaining a diet low on simple and added carbs.  She had questions about how to prepare healthy dishes including pastas.   She has lost 30% of total body weight so anticipate improvement in glycemic control.  Continue with nutritional and behavioral strategies.    Suspected sleep apnea Assessment  & Plan: Patient has symptoms of sleep disordered breathing and high risk phenotype.  Her neck circumference is 14.5 inches but she has a Mallampati of 4.  Counseled on the risk of undiagnosed and untreated sleep apnea.  I highly recommend polysomnography. She will work with primary care team on getting this scheduled.  Losing 15% of body weight may improve symptoms.      PHYSICAL EXAM:  Blood pressure 122/74, pulse 69, temperature 98.2 F (36.8 C), height 5\' 2"  (1.575 m), weight 294 lb (133.4 kg), SpO2 97%. Body mass index is 53.77 kg/m.  General: She is overweight, cooperative, alert, well developed, and in no acute distress. PSYCH: Has normal mood, affect and thought process.   HEENT: EOMI, sclerae are anicteric.  Lungs: Normal breathing effort, no conversational dyspnea. Extremities: No edema.  Neurologic: No gross sensory or motor deficits. No tremors or fasciculations noted.    DIAGNOSTIC DATA REVIEWED:  BMET    Component Value Date/Time   NA 141 04/04/2023 1049   K 3.9 04/04/2023 1049   CL 103 04/04/2023 1049   CO2 24 04/04/2023 1049   GLUCOSE 88 04/04/2023 1049   GLUCOSE 84 12/03/2019 1006   BUN 15 04/04/2023 1049   CREATININE 0.94 04/04/2023 1049   CALCIUM 9.1 04/04/2023 1049   GFRNONAA >60 12/03/2019 1006   GFRAA >60 12/03/2019 1006   Lab Results  Component Value Date   HGBA1C 5.9 (H) 04/04/2023   Lab Results  Component Value Date   INSULIN 10.4 04/04/2023   No results found for: "TSH" CBC    Component Value Date/Time   WBC 7.0 04/04/2023 1049   RBC 4.27 04/04/2023 1049   HGB 12.0 04/04/2023 1049   HCT 37.7 04/04/2023 1049   PLT 300 04/04/2023 1049   MCV 88 04/04/2023 1049   MCH 28.1 04/04/2023 1049   MCHC 31.8 04/04/2023 1049   RDW 13.0 04/04/2023 1049   Iron Studies No results found for: "IRON", "TIBC", "FERRITIN", "IRONPCTSAT" Lipid Panel  No results found for: "CHOL", "TRIG", "HDL", "CHOLHDL", "VLDL", "LDLCALC", "LDLDIRECT" Hepatic Function  Panel     Component Value Date/Time   PROT 7.0 04/04/2023 1049   ALBUMIN 3.9 04/04/2023 1049   AST 15 04/04/2023 1049   ALT 14 04/04/2023 1049   ALKPHOS 75 04/04/2023 1049   BILITOT 0.8 04/04/2023 1049   No results found for: "TSH" Nutritional Lab Results  Component Value Date   VD25OH 31.4 04/04/2023     No follow-ups on file.Marland Kitchen She was informed of the importance of frequent follow up visits to maximize her success with intensive lifestyle modifications for her multiple health conditions.   ATTESTASTION STATEMENTS:  Reviewed by clinician on day of visit: allergies, medications, problem list, medical history, surgical history, family history, social history, and previous encounter notes.   I have spent 30 minutes in the care of the patient today including: preparing to see patient (e.g. review and interpretation of tests, old notes ), obtaining and/or reviewing separately obtained history, performing a medically appropriate examination or evaluation, counseling and educating the patient, documenting clinical information in the electronic or other health care record, and independently interpreting results and communicating results to the patient, family, or caregiver   Worthy Rancher, MD

## 2023-07-25 NOTE — Assessment & Plan Note (Signed)
Most recent A1c is  Lab Results  Component Value Date   HGBA1C 5.9 (H) 04/04/2023   She has been maintaining a diet low on simple and added carbs.  She had questions about how to prepare healthy dishes including pastas.   She has lost 30% of total body weight so anticipate improvement in glycemic control.  Continue with nutritional and behavioral strategies.

## 2023-07-25 NOTE — Assessment & Plan Note (Signed)
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  This may affect hormonal balance particularly around orixegenic hormones.

## 2023-07-25 NOTE — Assessment & Plan Note (Signed)
Patient has symptoms of sleep disordered breathing and high risk phenotype.  Her neck circumference is 14.5 inches but she has a Mallampati of 4.  Counseled on the risk of undiagnosed and untreated sleep apnea.  I highly recommend polysomnography. She will work with primary care team on getting this scheduled.  Losing 15% of body weight may improve symptoms.

## 2023-07-25 NOTE — Assessment & Plan Note (Signed)
Peak weight is 347, starting weight 330.  She has lost 33 pounds or 13% of her starting weight.

## 2023-08-14 ENCOUNTER — Encounter (INDEPENDENT_AMBULATORY_CARE_PROVIDER_SITE_OTHER): Payer: Self-pay | Admitting: Family Medicine

## 2023-08-14 ENCOUNTER — Ambulatory Visit (INDEPENDENT_AMBULATORY_CARE_PROVIDER_SITE_OTHER): Payer: 59 | Admitting: Family Medicine

## 2023-08-14 VITALS — BP 97/67 | HR 66 | Temp 98.0°F | Ht 62.0 in | Wt 293.0 lb

## 2023-08-14 DIAGNOSIS — E559 Vitamin D deficiency, unspecified: Secondary | ICD-10-CM | POA: Diagnosis not present

## 2023-08-14 DIAGNOSIS — E669 Obesity, unspecified: Secondary | ICD-10-CM | POA: Diagnosis not present

## 2023-08-14 DIAGNOSIS — I9589 Other hypotension: Secondary | ICD-10-CM

## 2023-08-14 DIAGNOSIS — Z6841 Body Mass Index (BMI) 40.0 and over, adult: Secondary | ICD-10-CM

## 2023-08-14 DIAGNOSIS — I959 Hypotension, unspecified: Secondary | ICD-10-CM

## 2023-08-14 MED ORDER — VITAMIN D (ERGOCALCIFEROL) 1.25 MG (50000 UNIT) PO CAPS: 50000.0000 [IU] | ORAL_CAPSULE | ORAL | 0 refills | Status: DC

## 2023-08-14 NOTE — Progress Notes (Signed)
.[facility]  Office: [phone]  /  Fax: [phone]  WEIGHT SUMMARY AND BIOMETRICS  Anthropometric Measurements Height: 5\' 2"  (1.575 m) Weight: 293 lb (132.9 kg) BMI (Calculated): 53.58 Weight at Last Visit: 294 lb Weight Lost Since Last Visit: 1 lb Weight Gained Since Last Visit: 0 Starting Weight: 330 lb Total Weight Loss (lbs): 37 lb (16.8 kg) Peak Weight: 347 lb   Body Composition  Body Fat %: 57.5 % Fat Mass (lbs): 168.6 lbs Muscle Mass (lbs): 118.4 lbs Visceral Fat Rating : 23   Other Clinical Data Fasting: No Labs: No Today's Visit #: 7 Starting Date: 05/01/23    Chief Complaint: OBESITY   History of Present Illness   The patient, diagnosed with vitamin D deficiency and obesity, reports a weight loss of one pound over the past three weeks. She has been adhering to a category three eating plan approximately 75% of the time and has been engaging in physical activity, specifically walking, for about 60 minutes five times per week. She is currently on prescription vitamin D for her deficiency and requested a refill during this visit.  The patient also reported experiencing symptoms suggestive of a common cold, including sneezing, but denied any significant discomfort or distress. She has been managing these symptoms without medical intervention, attributing them to environmental factors such as indoor heating.  The patient also discussed her upcoming holiday plans, expressing a desire to maintain her current dietary and exercise regimen during this period. She expressed interest in strategies to manage her food intake during the holiday season, particularly Thanksgiving, to avoid overeating and potential weight gain.  The patient also reported an increase in nocturnal urination, which she attributed to high fluid intake during her work hours. She denied any associated symptoms such as lightheadedness or dizziness. Her blood pressure was noted to be 97/67, which was lower  than her usual readings, but she did not report any symptoms suggestive of hypotension.          PHYSICAL EXAM:  Blood pressure 97/67, pulse 66, temperature 98 F (36.7 C), height 5\' 2"  (1.575 m), weight 293 lb (132.9 kg), SpO2 99%. Body mass index is 53.59 kg/m.  DIAGNOSTIC DATA REVIEWED:  BMET    Component Value Date/Time   NA 141 04/04/2023 1049   K 3.9 04/04/2023 1049   CL 103 04/04/2023 1049   CO2 24 04/04/2023 1049   GLUCOSE 88 04/04/2023 1049   GLUCOSE 84 12/03/2019 1006   BUN 15 04/04/2023 1049   CREATININE 0.94 04/04/2023 1049   CALCIUM 9.1 04/04/2023 1049   GFRNONAA >60 12/03/2019 1006   GFRAA >60 12/03/2019 1006   Lab Results  Component Value Date   HGBA1C 5.9 (H) 04/04/2023   Lab Results  Component Value Date   INSULIN 10.4 04/04/2023   No results found for: "TSH" CBC    Component Value Date/Time   WBC 7.0 04/04/2023 1049   RBC 4.27 04/04/2023 1049   HGB 12.0 04/04/2023 1049   HCT 37.7 04/04/2023 1049   PLT 300 04/04/2023 1049   MCV 88 04/04/2023 1049   MCH 28.1 04/04/2023 1049   MCHC 31.8 04/04/2023 1049   RDW 13.0 04/04/2023 1049   Iron Studies No results found for: "IRON", "TIBC", "FERRITIN", "IRONPCTSAT" Lipid Panel  No results found for: "CHOL", "TRIG", "HDL", "CHOLHDL", "VLDL", "LDLCALC", "LDLDIRECT" Hepatic Function Panel     Component Value Date/Time   PROT 7.0 04/04/2023 1049   ALBUMIN 3.9 04/04/2023 1049   AST 15 04/04/2023 1049  ALT 14 04/04/2023 1049   ALKPHOS 75 04/04/2023 1049   BILITOT 0.8 04/04/2023 1049   No results found for: "TSH" Nutritional Lab Results  Component Value Date   VD25OH 31.4 04/04/2023     Assessment and Plan    Obesity   Patient has lost one pound in the last three weeks. She is adhering to a category three eating plan 75% of the time and walking 60 minutes five times per week. Discussed Thanksgiving eating strategies based on a study which showed that eating strategies can reduce caloric  intake by 40% while maintaining satisfaction. Emphasized the importance of not letting food touch on the plate, reassessing hunger levels, and savoring each bite to avoid overeating.   - Continue category three eating plan   - Continue walking 60 minutes five times per week   - Implement Thanksgiving eating strategies as discussed    Hypotension   Blood pressure recorded at 97/67 mmHg. Patient denies current lightheadedness or dizziness but reported a single episode of dizziness at work last week. Discussed the importance of hydration, especially during active weight loss. Explained that asymptomatic low blood pressure is not concerning but should be monitored for symptoms.   - Monitor for symptoms of lightheadedness or dizziness   - Increase hydration, especially during active weight loss    Vitamin D Deficiency   Chronic vitamin D deficiency managed with prescription vitamin D. Patient requests a refill today.   - Refill prescription vitamin D    Follow-up   - Schedule follow-up appointment in 3-4 weeks.       She was informed of the importance of frequent follow up visits to maximize her success with intensive lifestyle modifications for her multiple health conditions.    Quillian Quince, MD

## 2023-09-12 ENCOUNTER — Encounter (INDEPENDENT_AMBULATORY_CARE_PROVIDER_SITE_OTHER): Payer: Self-pay | Admitting: Internal Medicine

## 2023-09-12 ENCOUNTER — Ambulatory Visit (INDEPENDENT_AMBULATORY_CARE_PROVIDER_SITE_OTHER): Payer: 59 | Admitting: Internal Medicine

## 2023-09-12 VITALS — BP 98/68 | HR 69 | Temp 97.9°F | Ht 62.0 in | Wt 292.0 lb

## 2023-09-12 DIAGNOSIS — Z6841 Body Mass Index (BMI) 40.0 and over, adult: Secondary | ICD-10-CM

## 2023-09-12 DIAGNOSIS — E669 Obesity, unspecified: Secondary | ICD-10-CM | POA: Diagnosis not present

## 2023-09-12 DIAGNOSIS — G4720 Circadian rhythm sleep disorder, unspecified type: Secondary | ICD-10-CM | POA: Diagnosis not present

## 2023-09-12 DIAGNOSIS — R7303 Prediabetes: Secondary | ICD-10-CM | POA: Diagnosis not present

## 2023-09-12 DIAGNOSIS — R2990 Unspecified symptoms and signs involving the nervous system: Secondary | ICD-10-CM

## 2023-09-12 NOTE — Progress Notes (Signed)
Office: [phone]  /  Fax: [phone]  Weight Summary And Biometrics  Vitals Temp: 97.9 F (36.6 C) BP: 98/68 Pulse Rate: 69 SpO2: 98 %   Anthropometric Measurements Height: 5\' 2"  (1.575 m) Weight: 292 lb (132.5 kg) BMI (Calculated): 53.39 Weight at Last Visit: 293 lb Weight Lost Since Last Visit: 1 lb Weight Gained Since Last Visit: 0 lb Starting Weight: 330 lb Total Weight Loss (lbs): 38 lb (17.2 kg) Peak Weight: 347 lb   Body Composition  Body Fat %: 57.3 % Fat Mass (lbs): 167.6 lbs Muscle Mass (lbs): 118.6 lbs Visceral Fat Rating : 22     No data recorded Today's Visit #: 8  Starting Date: 05/01/23   Subjective   Chief Complaint: Obesity  Renee Brennan is here to discuss her progress with her obesity treatment plan. She is on the the Category 3 Plan and states she is following her eating plan approximately 50 % of the time. She states she is exercising 60 minutes 5 times per week.  Interval History:   Discussed the use of AI scribe software for clinical note transcription with the patient, who gave verbal consent to proceed.  History of Present Illness   The patient, a 52 year old individual with obesity and a history of prediabetes, presents for a medical weight management consultation. 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 However, she notes a recent change in her job role, which now involves more sedentary desk work.  The patient reports occasional swelling in her ankles, which she manages by moving in place to maintain blood circulation. Despite these changes, she continues to work night shifts. She has not had a recent primary care visit and has not yet pursued a sleep study, previously discussed due to suspected sleep apnea. She reports occasional  snoring but denies waking up gasping for air or feeling unrefreshed upon waking. She attributes any tiredness to her irregular sleep schedule due to night shift work and family responsibilities.  The patient acknowledges occasional meal skipping and believes she may not be consuming enough protein. She expresses a desire to avoid additional medications and prefers to manage her prediabetes through dietary changes. She reports a history of gestational diabetes with her middle child. She also mentions a longstanding issue with vitamin D deficiency, which she manages with dietary adjustments, including the consumption of Fairlife milk and [place] yogurt.  The patient's weight loss progress has slowed since October, which she attributes to changes in her living situation and dietary habits. She expresses a willingness to adjust her caloric intake and focus on protein consumption to boost her metabolism and continue her weight loss journey. She also expresses interest in using tracking apps and AI assistance for meal planning and calorie counting. The patient is motivated to continue her weight management efforts and is open to strategies to stay on track during the holiday season.        Barriers identified: multiple competing priorities.   Pharmacotherapy for weight loss: She is currently taking no anti-obesity medication.   Assessment and Plan   Treatment Plan For Obesity:  Recommended Dietary Goals  Renee Brennan is currently in the action stage of change. As such, her goal is to continue weight management plan. She has agreed to: continue current plan  Behavioral Intervention  We discussed the following Behavioral Modification Strategies today:  continue to work on maintaining a reduced calorie state, getting the recommended amount of protein, incorporating whole foods, making healthy choices, staying well hydrated and practicing mindfulness when eating..  Additional resources provided today:  Handout and personalized instruction on tracking and journaling using Apps, Handout on traveling and holiday eating strategies, and Handout on symptoms of sleep apnea, health risk and its effect on weight management  Recommended Physical Activity Goals  Renee Brennan has been advised to work up to 150 minutes of moderate intensity aerobic activity a week and strengthening exercises 2-3 times per week for cardiovascular health, weight loss maintenance and preservation of muscle mass.   She has agreed to :  continue to gradually increase the amount and intensity of exercise   Pharmacotherapy  We discussed various medication options to help Renee Brennan with her weight loss efforts and we both agreed to : continue with nutritional and behavioral strategies  Associated Conditions Addressed Today    Assessment and Plan    Obesity   At 52 years old, she is engaging in medical weight management and has experienced weight loss despite holiday challenges, though progress has slowed, likely due to inconsistent calorie and protein intake. We discussed the importance of a balanced diet, adequate protein, and tracking intake, recommending a daily intake of 1300 calories and 100 grams of protein to boost metabolism and manage prediabetes. She is advised to track her food intake three days a week using apps like lose it, MyFitnessPal, or MyNetDiary, focus on balanced meals with a quarter plate of carbs, half plate of fruits and vegetables, and adequate protein. She can use ChatGPT for recipe ideas and meal planning, with a follow-up scheduled in five weeks.  She received demonstration on the use of these apps today  Prediabetes   With a history of prediabetes and gestational diabetes, she prefers diet and lifestyle management. We discussed maintaining a low-carb, high-protein diet, monitoring carbohydrate intake, and continuing regular exercise to prevent diabetes progression. She will follow up with primary care in  March for a physical and blood tests.  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 We discussed the impact of shift work on circadian rhythms and the importance of sleep hygiene, advising her to continue her current work schedule, monitor sleep patterns, and adjust as needed, considering sleep hygiene practices to improve sleep quality.  Suspected Sleep Apnea   She has suspected sleep apnea without common symptoms like waking up gasping, morning headaches, or excessive daytime sleepiness, and snoring is not severe. We discussed the benefits of weight loss and determined there is no immediate need for a sleep study unless symptoms worsen, advising her to monitor for any new symptoms of sleep apnea and reassess if symptoms worsen.  General Health Maintenance   She is due for a physical and blood tests in March. We advised on maintaining a balanced diet with a focus on protein and low carbs, continuing regular exercise, routine screenings, and preventative health measures, with a follow-up with primary care in March for physical and blood tests.  Follow-up   She will follow up in five weeks for a weight management review and with primary care in March for physical and blood tests.        Objective   Physical Exam:  Blood pressure 98/68, pulse 69, temperature 97.9 F (36.6 C), height 5\' 2"  (1.575 m), weight 292 lb (132.5 kg), SpO2 98%. Body mass index is 53.41  kg/m.  General: She is overweight, cooperative, alert, well developed, and in no acute distress. PSYCH: Has normal mood, affect and thought process.   HEENT: EOMI, sclerae are anicteric. Lungs: Normal breathing effort, no conversational dyspnea. Extremities: No edema.  Neurologic: No gross sensory or motor deficits. No tremors or fasciculations noted.    Diagnostic Data Reviewed:  BMET    Component Value Date/Time   NA 141  04/04/2023 1049   K 3.9 04/04/2023 1049   CL 103 04/04/2023 1049   CO2 24 04/04/2023 1049   GLUCOSE 88 04/04/2023 1049   GLUCOSE 84 12/03/2019 1006   BUN 15 04/04/2023 1049   CREATININE 0.94 04/04/2023 1049   CALCIUM 9.1 04/04/2023 1049   GFRNONAA >60 12/03/2019 1006   GFRAA >60 12/03/2019 1006   Lab Results  Component Value Date   HGBA1C 5.9 (H) 04/04/2023   Lab Results  Component Value Date   INSULIN 10.4 04/04/2023   No results found for: "TSH" CBC    Component Value Date/Time   WBC 7.0 04/04/2023 1049   RBC 4.27 04/04/2023 1049   HGB 12.0 04/04/2023 1049   HCT 37.7 04/04/2023 1049   PLT 300 04/04/2023 1049   MCV 88 04/04/2023 1049   MCH 28.1 04/04/2023 1049   MCHC 31.8 04/04/2023 1049   RDW 13.0 04/04/2023 1049   Iron Studies No results found for: "IRON", "TIBC", "FERRITIN", "IRONPCTSAT" Lipid Panel  No results found for: "CHOL", "TRIG", "HDL", "CHOLHDL", "VLDL", "LDLCALC", "LDLDIRECT" Hepatic Function Panel     Component Value Date/Time   PROT 7.0 04/04/2023 1049   ALBUMIN 3.9 04/04/2023 1049   AST 15 04/04/2023 1049   ALT 14 04/04/2023 1049   ALKPHOS 75 04/04/2023 1049   BILITOT 0.8 04/04/2023 1049   No results found for: "TSH" Nutritional Lab Results  Component Value Date   VD25OH 31.4 04/04/2023    Follow-Up   Return in about 4 weeks (around 10/10/2023) for For Weight Mangement with Dr. Rikki Spearing.Marland Kitchen She was informed of the importance of frequent follow up visits to maximize her success with intensive lifestyle modifications for her multiple health conditions.  Attestation Statement   Reviewed by clinician on day of visit: allergies, medications, problem list, medical history, surgical history, family history, social history, and previous encounter notes.   I have spent 30 minutes in the care of the patient today including: preparing to see patient (e.g. review and interpretation of tests, old notes ), obtaining and/or reviewing separately obtained  history, performing a medically appropriate examination or evaluation, counseling and educating the patient, documenting clinical information in the electronic or other health care record, and independently interpreting results and communicating results to the patient, family, or caregiver   Worthy Rancher, MD

## 2023-09-12 NOTE — Assessment & Plan Note (Signed)
Most recent A1c is  Lab Results  Component Value Date   HGBA1C 5.9 (H) 04/04/2023   She has been maintaining a diet low on simple and added carbs.  She had questions about how to prepare healthy dishes including pastas.   She has lost 30% of total body weight so anticipate improvement in glycemic control.  Continue with nutritional and behavioral strategies.

## 2023-10-17 ENCOUNTER — Ambulatory Visit (INDEPENDENT_AMBULATORY_CARE_PROVIDER_SITE_OTHER): Payer: 59 | Admitting: Internal Medicine

## 2023-10-30 ENCOUNTER — Encounter (INDEPENDENT_AMBULATORY_CARE_PROVIDER_SITE_OTHER): Payer: Self-pay | Admitting: Family Medicine

## 2023-10-30 ENCOUNTER — Ambulatory Visit (INDEPENDENT_AMBULATORY_CARE_PROVIDER_SITE_OTHER): Payer: 59 | Admitting: Family Medicine

## 2023-10-30 VITALS — BP 102/70 | HR 67 | Temp 98.2°F | Ht 62.0 in | Wt 289.0 lb

## 2023-10-30 DIAGNOSIS — E538 Deficiency of other specified B group vitamins: Secondary | ICD-10-CM

## 2023-10-30 DIAGNOSIS — R7303 Prediabetes: Secondary | ICD-10-CM

## 2023-10-30 DIAGNOSIS — Z6841 Body Mass Index (BMI) 40.0 and over, adult: Secondary | ICD-10-CM | POA: Diagnosis not present

## 2023-10-30 DIAGNOSIS — R29818 Other symptoms and signs involving the nervous system: Secondary | ICD-10-CM | POA: Diagnosis not present

## 2023-10-30 NOTE — Progress Notes (Signed)
SUBJECTIVE:  Chief Complaint: Obesity  Interim History: Patient stayed local over the holidays and worked a lot.  She is still working quite a bit.  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 She stands a lot but isn't moving as much as she did previously. Working 3:30-11/12.  She like the breakfast.  Lunch and dinner change a lot and sometime she does a protein shake.   Renee Brennan is here to discuss her progress with her obesity treatment plan. She is on the Category 3 Plan and states she is following her eating plan approximately 50 % of the time. She states she is walking at work 3,000 steps 5 times per week.   OBJECTIVE: Visit Diagnoses: Problem List Items Addressed This Visit       Other   Pre-diabetes - Primary   Discussed the importance of continuing on with monitoring her macronutrient composition.  Patient needs repeat labs today so a repeat hemoglobin A1c and insulin level were ordered.  She had questions about what options are available to make quick and an easy meals while controlling the carbohydrates that are included in these meals.  Handout of high-protein Posta's and cereals was given to patient today.      Relevant Orders   Hemoglobin A1c (Completed)   Insulin, random (Completed)   B12 deficiency   Patient has been taking over-the-counter vitamin B12 supplement and was previously on 2500 mcg daily but her last vitamin B12 level was high.  Will repeat B12 level as well as a CBC today.  Will discuss lab results at next appointment and provide further management at that time.      Relevant Orders   Vitamin B12 (Completed)   CBC w/Diff/Platelet (Completed)   Morbid obesity starting BMI 60.4   BMI 50.0-59.9, adult (HCC)   Suspected sleep apnea   Discussed once again possible evaluation for sleep apnea today.  Patient is going to consider referral for sleep test.  In addition to her B12 level and drawing a CBC for her B12 deficiency but will cross  check her CBC results to see if there is any signs of erythrocytosis which could be due to possible sleep apnea.         10/30/2023   11:00 AM 09/12/2023   11:00 AM 08/14/2023   12:00 PM  Vitals with BMI  Height 5\' 2"  5\' 2"  5\' 2"   Weight 289 lbs 292 lbs 293 lbs  BMI 52.85 53.39 53.58  Systolic 102 98 97  Diastolic 70 68 67  Pulse 67 69 66    No data recorded No data recorded No data recorded No data recorded   ASSESSMENT AND PLAN:  Diet: Renee Brennan is currently in the action stage of change. As such, her goal is to continue with weight loss efforts. She has agreed to Category 3 Plan.  We discussed easy to make options for category 3 meals in which patient can prepare in 15 to 20 minutes or less.  Exercise: Renee Brennan has been instructed that some exercise is better than none for weight loss and overall health benefits.   Behavior Modification:  We discussed the following Behavioral Modification Strategies today: increasing lean protein intake, increasing vegetables, no skipping meals, meal planning and cooking strategies, and better snacking choices.   No follow-ups on file.Marland Kitchen She was informed of the importance of frequent follow up visits to maximize her success with intensive lifestyle modifications for her multiple health conditions.  Attestation Statements:  Reviewed by clinician on day of visit: allergies, medications, problem list, medical history, surgical history, family history, social history, and previous encounter notes.      Reuben Likes, MD

## 2023-10-31 LAB — VITAMIN B12: Vitamin B-12: >2000 pg/mL — ABNORMAL HIGH (ref 232–1245)

## 2023-10-31 LAB — CBC WITH DIFFERENTIAL/PLATELET
Basophils Absolute: 0.1 10*3/uL (ref 0.0–0.2)
Basos: 1 %
EOS (ABSOLUTE): 0.2 10*3/uL (ref 0.0–0.4)
Eos: 3 %
Hematocrit: 39 % (ref 34.0–46.6)
Hemoglobin: 12.5 g/dL (ref 11.1–15.9)
Immature Grans (Abs): 0 10*3/uL (ref 0.0–0.1)
Immature Granulocytes: 0 %
Lymphocytes Absolute: 1.9 10*3/uL (ref 0.7–3.1)
Lymphs: 28 %
MCH: 28.9 pg (ref 26.6–33.0)
MCHC: 32.1 g/dL (ref 31.5–35.7)
MCV: 90 fL (ref 79–97)
Monocytes Absolute: 0.7 10*3/uL (ref 0.1–0.9)
Monocytes: 9 %
Neutrophils Absolute: 4.1 10*3/uL (ref 1.4–7.0)
Neutrophils: 59 %
Platelets: 272 10*3/uL (ref 150–450)
RBC: 4.32 x10E6/uL (ref 3.77–5.28)
RDW: 12.5 % (ref 11.7–15.4)
WBC: 6.9 10*3/uL (ref 3.4–10.8)

## 2023-10-31 LAB — INSULIN, RANDOM: INSULIN: 4.7 u[IU]/mL (ref 2.6–24.9)

## 2023-10-31 LAB — HEMOGLOBIN A1C
Est. average glucose Bld gHb Est-mCnc: 117 mg/dL
Hgb A1c MFr Bld: 5.7 % — ABNORMAL HIGH (ref 4.8–5.6)

## 2023-11-06 ENCOUNTER — Encounter (INDEPENDENT_AMBULATORY_CARE_PROVIDER_SITE_OTHER): Payer: Self-pay | Admitting: Family Medicine

## 2023-11-06 NOTE — Assessment & Plan Note (Signed)
 Discussed the importance of continuing on with monitoring her macronutrient composition.  Patient needs repeat labs today so a repeat hemoglobin A1c and insulin  level were ordered.  She had questions about what options are available to make quick and an easy meals while controlling the carbohydrates that are included in these meals.  Handout of high-protein Posta's and cereals was given to patient today.

## 2023-11-06 NOTE — Assessment & Plan Note (Signed)
 Discussed once again possible evaluation for sleep apnea today.  Patient is going to consider referral for sleep test.  In addition to her B12 level and drawing a CBC for her B12 deficiency but will cross check her CBC results to see if there is any signs of erythrocytosis which could be due to possible sleep apnea.

## 2023-11-06 NOTE — Assessment & Plan Note (Signed)
 Patient has been taking over-the-counter vitamin B12 supplement and was previously on 2500 mcg daily but her last vitamin B12 level was high.  Will repeat B12 level as well as a CBC today.  Will discuss lab results at next appointment and provide further management at that time.

## 2023-11-27 ENCOUNTER — Ambulatory Visit (INDEPENDENT_AMBULATORY_CARE_PROVIDER_SITE_OTHER): Payer: 59 | Admitting: Family Medicine

## 2023-11-27 ENCOUNTER — Encounter (INDEPENDENT_AMBULATORY_CARE_PROVIDER_SITE_OTHER): Payer: Self-pay | Admitting: Family Medicine

## 2023-11-27 VITALS — BP 109/75 | HR 84 | Temp 98.3°F | Ht 62.0 in | Wt 291.0 lb

## 2023-11-27 DIAGNOSIS — E538 Deficiency of other specified B group vitamins: Secondary | ICD-10-CM | POA: Diagnosis not present

## 2023-11-27 DIAGNOSIS — E559 Vitamin D deficiency, unspecified: Secondary | ICD-10-CM | POA: Diagnosis not present

## 2023-11-27 DIAGNOSIS — R5382 Chronic fatigue, unspecified: Secondary | ICD-10-CM | POA: Diagnosis not present

## 2023-11-27 DIAGNOSIS — R7303 Prediabetes: Secondary | ICD-10-CM

## 2023-11-27 DIAGNOSIS — Z6841 Body Mass Index (BMI) 40.0 and over, adult: Secondary | ICD-10-CM

## 2023-11-27 MED ORDER — VITAMIN D (ERGOCALCIFEROL) 1.25 MG (50000 UNIT) PO CAPS: 50000.0000 [IU] | ORAL_CAPSULE | ORAL | 0 refills | Status: DC

## 2023-11-27 NOTE — Progress Notes (Signed)
 SUBJECTIVE:  Chief Complaint: Obesity  Interim History: Patient has been working and been at home the last month.  She has gone to the movies since the last appointment. Since last appointment M-F she has done the breakfast from the plan with substitution of bread for oatmeal.  She then has a cup of yogurt, snack with nuts or popcorn 100 calorie pack and protein shake, apple maybe a cheese stick.  She voices work has become increasing stressful over the last few weeks due to some micromanaging going on.   Renee Brennan is here to discuss her progress with her obesity treatment plan. She is on the Category 3 Plan and states she is following her eating plan approximately 50 % of the time. She states she is walking at work 3,000-4,000 steps.   OBJECTIVE: Visit Diagnoses: Problem List Items Addressed This Visit       Other   Pre-diabetes - Primary   A1c improved from 5.9 to 5.7 on labs done at last appointment. Insulin level also improved- we discussed labs today.       Vitamin D deficiency   Doing well on prescription strength Vitamin D supplementation.  Last Vitamin D level from July.  Needs repeat D level at next appointment.  Needs refill today.      Relevant Medications   Vitamin D, Ergocalciferol, (DRISDOL) 1.25 MG (50000 UNIT) CAPS capsule   Morbid obesity starting BMI 60.4   Anthropometric Measurements Height: 5\' 2"  (1.575 m) Weight: 291 lb (132 kg) BMI (Calculated): 53.21 Weight at Last Visit: 289 lb Weight Lost Since Last Visit: 0 Weight Gained Since Last Visit: 2 Starting Weight: 330 lb Total Weight Loss (lbs): 39 lb (17.7 kg)  Body Composition  Body Fat %: 57.4 % Fat Mass (lbs): 167 lbs Muscle Mass (lbs): 117.8 lbs Visceral Fat Rating : 22  Other Clinical Data Today's Visit #: 10 Starting Date: 05/01/23 Comments: Cat 3          No data recorded       ASSESSMENT AND PLAN:  Diet: Renee Brennan is currently in the action stage of change. As such, her  goal is to continue with weight loss efforts and has agreed to Category 3 and keeping a food journal and adhering to recommended goals of 300-400 calories for lunch and 400-500 calories at dinner and 25 or more grams at lunch and 35 or more grams at lunch protein. The expectation it that patient may not initially meet calorie or protein goals as the nturitional understanding of food intake is begun.  We discussed the 10:1 ratio when reading a food label.  Patient agrees to keep a food log either electronically or on paper and bring to the next appointment to be able to dissect and discuss it with provider.    Exercise:  All adults should avoid inactivity. Some activity is better than none, and adults who participate in any amount of physical activity, gain some health benefits.  Behavior Modification:  We discussed the following Behavioral Modification Strategies today: increasing lean protein intake, increasing vegetables, meal planning and cooking strategies, better snacking choices, and planning for success.   No follow-ups on file.Marland Kitchen She was informed of the importance of frequent follow up visits to maximize her success with intensive lifestyle modifications for her multiple health conditions.  Attestation Statements:   Reviewed by clinician on day of visit: allergies, medications, problem list, medical history, surgical history, family history, social history, and previous encounter notes.     [place]  Lawson Radar, MD

## 2023-11-27 NOTE — Assessment & Plan Note (Signed)
 A1c improved from 5.9 to 5.7 on labs done at last appointment. Insulin level also improved- we discussed labs today.

## 2023-12-04 DIAGNOSIS — Z6841 Body Mass Index (BMI) 40.0 and over, adult: Secondary | ICD-10-CM | POA: Diagnosis not present

## 2023-12-04 DIAGNOSIS — E538 Deficiency of other specified B group vitamins: Secondary | ICD-10-CM | POA: Diagnosis not present

## 2023-12-04 DIAGNOSIS — Z0000 Encounter for general adult medical examination without abnormal findings: Secondary | ICD-10-CM | POA: Diagnosis not present

## 2023-12-04 DIAGNOSIS — E559 Vitamin D deficiency, unspecified: Secondary | ICD-10-CM | POA: Diagnosis not present

## 2023-12-04 DIAGNOSIS — Z1231 Encounter for screening mammogram for malignant neoplasm of breast: Secondary | ICD-10-CM | POA: Diagnosis not present

## 2023-12-04 DIAGNOSIS — R7303 Prediabetes: Secondary | ICD-10-CM | POA: Diagnosis not present

## 2023-12-04 DIAGNOSIS — G4733 Obstructive sleep apnea (adult) (pediatric): Secondary | ICD-10-CM | POA: Diagnosis not present

## 2023-12-05 ENCOUNTER — Other Ambulatory Visit: Payer: Self-pay | Admitting: Internal Medicine

## 2023-12-05 DIAGNOSIS — Z1231 Encounter for screening mammogram for malignant neoplasm of breast: Secondary | ICD-10-CM

## 2023-12-10 NOTE — Assessment & Plan Note (Signed)
 Anthropometric Measurements Height: 5\' 2"  (1.575 m) Weight: 291 lb (132 kg) BMI (Calculated): 53.21 Weight at Last Visit: 289 lb Weight Lost Since Last Visit: 0 Weight Gained Since Last Visit: 2 Starting Weight: 330 lb Total Weight Loss (lbs): 39 lb (17.7 kg)  Body Composition  Body Fat %: 57.4 % Fat Mass (lbs): 167 lbs Muscle Mass (lbs): 117.8 lbs Visceral Fat Rating : 22  Other Clinical Data Today's Visit #: 10 Starting Date: 05/01/23 Comments: Cat 3

## 2023-12-10 NOTE — Assessment & Plan Note (Signed)
 Doing well on prescription strength Vitamin D supplementation.  Last Vitamin D level from July.  Needs repeat D level at next appointment.  Needs refill today.

## 2023-12-25 ENCOUNTER — Ambulatory Visit (INDEPENDENT_AMBULATORY_CARE_PROVIDER_SITE_OTHER): Payer: 59 | Admitting: Family Medicine

## 2023-12-25 ENCOUNTER — Ambulatory Visit (INDEPENDENT_AMBULATORY_CARE_PROVIDER_SITE_OTHER): Admitting: Adult Health

## 2023-12-25 ENCOUNTER — Encounter (INDEPENDENT_AMBULATORY_CARE_PROVIDER_SITE_OTHER): Payer: Self-pay | Admitting: Adult Health

## 2023-12-25 DIAGNOSIS — E538 Deficiency of other specified B group vitamins: Secondary | ICD-10-CM | POA: Diagnosis not present

## 2023-12-25 DIAGNOSIS — E669 Obesity, unspecified: Secondary | ICD-10-CM

## 2023-12-25 DIAGNOSIS — E559 Vitamin D deficiency, unspecified: Secondary | ICD-10-CM

## 2023-12-25 DIAGNOSIS — R7303 Prediabetes: Secondary | ICD-10-CM | POA: Diagnosis not present

## 2023-12-25 DIAGNOSIS — Z6841 Body Mass Index (BMI) 40.0 and over, adult: Secondary | ICD-10-CM | POA: Diagnosis not present

## 2023-12-25 MED ORDER — VITAMIN D (ERGOCALCIFEROL) 1.25 MG (50000 UNIT) PO CAPS: 50000.0000 [IU] | ORAL_CAPSULE | ORAL | 0 refills | Status: DC

## 2023-12-25 NOTE — Progress Notes (Signed)
 WEIGHT SUMMARY AND BIOMETRICS  Vitals Temp: 98.1 F (36.7 C) BP: (!) 89/60 Pulse Rate: 69 SpO2: 100 %   Anthropometric Measurements Height: 5\' 2"  (1.575 m) Weight: 289 lb (131.1 kg) BMI (Calculated): 52.85 Weight at Last Visit: 291 lb Weight Lost Since Last Visit: 2 Weight Gained Since Last Visit: 0 Starting Weight: 330 lb Total Weight Loss (lbs): 41 lb (18.6 kg)   Body Composition  Body Fat %: 55.7 % Fat Mass (lbs): 161.2 lbs Muscle Mass (lbs): 121.8 lbs Total Body Water (lbs): 95.6 lbs Visceral Fat Rating : 22   Other Clinical Data Today's Visit #: 11 Starting Date: 05/01/23    Chief Complaint:   OBESITY Renee Brennan is here to discuss her progress with her obesity treatment plan.  She is on the the Category 3 Plan and states she is following her eating plan approximately 50 % of the time.  Lunch: 300-400 cal/25g protein Dinner: 400-500 cal/35g protein  She states she is exercising Walking at work 30 minutes 5 times per week.  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 Her daughter is Nurse, adult in home  Reviewed Bioimpedance results with pt: Muscle Mass: +4 lbs Adipose Mass: -5.8 lbs  Subjective:  1. Pre-diabetes Lab Results  Component Value Date   HGBA1C 5.7 (H) 10/30/2023   HGBA1C 5.9 (H) 04/04/2023    A1c improving ,yet still slightly above goal Of Note- She has Mirena IUD  2. B12 deficiency  Latest Reference Range & Units 04/04/23 10:49 10/30/23 11:42  Vitamin B12 232 - 1245 pg/mL >2000 (H) >2000 (H)  (H): Data is  abnormally high  Renee Brennan stopped all oral B12 supplementation approximately 4 weeks ago   3. Vitamin D deficiency  Latest Reference Range & Units 04/04/23 10:49  Vitamin D, 25-Hydroxy 30.0 - 100.0 ng/mL 31.4   She is on weekly Ergocalciferol- denies N/V/Muscle Weakness  Assessment/Plan:   1. Pre-diabetes Increase lean protein and limit sugar/simple CHO Remain active daily  2. B12 deficiency Remain off all oral B12 supplementation  3. Vitamin D deficiency Refill - Vitamin D, Ergocalciferol, (DRISDOL) 1.25 MG (50000 UNIT) CAPS capsule; Take 1 capsule (50,000 Units total) by mouth every 7 (seven) days.  Dispense: 5 capsule; Refill: 0  4. BMI 50.0-59.9, adult (HCC), Current BMI 52.9  Renee Brennan is currently in the action stage of change. As such, her goal is to continue with weight loss efforts. She has agreed to the Category 3 Plan.   Exercise goals: All adults should avoid inactivity. Some physical activity is better than none, and adults who participate in any amount of physical activity gain some health benefits. Adults should also include muscle-strengthening activities that involve all major muscle groups on 2 or more days a week.  Behavioral modification strategies: increasing lean protein intake, decreasing simple carbohydrates, increasing vegetables, increasing water intake, no skipping meals, meal planning and cooking strategies, keeping healthy foods in the home, and planning for success.  Renee Brennan has agreed to follow-up with our clinic in 3 weeks. She was informed of  the importance of frequent follow-up visits to maximize her success with intensive lifestyle modifications for her multiple health conditions.   Objective:   Blood pressure (!) 89/60, pulse 69, temperature 98.1 F (36.7 C), height 5\' 2"  (1.575 m), weight 289 lb (131.1 kg), SpO2 100%. Body mass index is 52.86 kg/m.  General: Cooperative, alert, well developed, in no acute distress. HEENT: Conjunctivae  and lids unremarkable. Cardiovascular: Regular rhythm.  Lungs: Normal work of breathing. Neurologic: No focal deficits.   Lab Results  Component Value Date   CREATININE 0.94 04/04/2023   BUN 15 04/04/2023   NA 141 04/04/2023   K 3.9 04/04/2023   CL 103 04/04/2023   CO2 24 04/04/2023   Lab Results  Component Value Date   ALT 14 04/04/2023   AST 15 04/04/2023   ALKPHOS 75 04/04/2023   BILITOT 0.8 04/04/2023   Lab Results  Component Value Date   HGBA1C 5.7 (H) 10/30/2023   HGBA1C 5.9 (H) 04/04/2023   Lab Results  Component Value Date   INSULIN 4.7 10/30/2023   INSULIN 10.4 04/04/2023   No results found for: "TSH" No results found for: "CHOL", "HDL", "LDLCALC", "LDLDIRECT", "TRIG", "CHOLHDL" Lab Results  Component Value Date   VD25OH 31.4 04/04/2023   Lab Results  Component Value Date   WBC 6.9 10/30/2023   HGB 12.5 10/30/2023   HCT 39.0 10/30/2023   MCV 90 10/30/2023   PLT 272 10/30/2023   No results found for: "IRON", "TIBC", "FERRITIN"  Attestation Statements:   Reviewed by clinician on day of visit: allergies, medications, problem list, medical history, surgical history, family history, social history, and previous encounter notes.  I have reviewed the above documentation for accuracy and completeness, and I agree with the above. -  Davona Kinoshita d. Daryon Remmert, NP-C

## 2024-02-05 ENCOUNTER — Ambulatory Visit (INDEPENDENT_AMBULATORY_CARE_PROVIDER_SITE_OTHER): Admitting: Adult Health

## 2024-02-05 ENCOUNTER — Encounter (INDEPENDENT_AMBULATORY_CARE_PROVIDER_SITE_OTHER): Payer: Self-pay | Admitting: Adult Health

## 2024-02-05 VITALS — BP 121/75 | HR 63 | Temp 98.2°F | Ht 62.0 in | Wt 294.0 lb

## 2024-02-05 DIAGNOSIS — E669 Obesity, unspecified: Secondary | ICD-10-CM

## 2024-02-05 DIAGNOSIS — Z0000 Encounter for general adult medical examination without abnormal findings: Secondary | ICD-10-CM | POA: Diagnosis not present

## 2024-02-05 DIAGNOSIS — R7303 Prediabetes: Secondary | ICD-10-CM

## 2024-02-05 DIAGNOSIS — E559 Vitamin D deficiency, unspecified: Secondary | ICD-10-CM | POA: Diagnosis not present

## 2024-02-05 DIAGNOSIS — Z6841 Body Mass Index (BMI) 40.0 and over, adult: Secondary | ICD-10-CM | POA: Diagnosis not present

## 2024-02-05 NOTE — Progress Notes (Signed)
 WEIGHT SUMMARY AND BIOMETRICS  Vitals Temp: 98.2 F (36.8 C) BP: 121/75 Pulse Rate: 63 SpO2: 99 %   Anthropometric Measurements Height: 5\' 2"  (1.575 m) Weight: 294 lb (133.4 kg) BMI (Calculated): 53.76 Weight at Last Visit: 289 lb Weight Lost Since Last Visit: 0 Weight Gained Since Last Visit: 5 lb Starting Weight: 330 lb Total Weight Loss (lbs): 36 lb (16.3 kg)   Body Composition  Body Fat %: 56.9 % Fat Mass (lbs): 167.2 lbs Muscle Mass (lbs): 120.4 lbs Visceral Fat Rating : 22   Other Clinical Data Fasting: no Labs: no Today's Visit #: 12 Starting Date: 05/01/23    Chief Complaint:   OBESITY Renee Brennan is here to discuss her progress with her obesity treatment plan.  She is on the the Category 3 Plan and states she is following her eating plan approximately 50 % of the time.  She states she is exercising Walking at Work 20 minutes 5 times per week.  Interim History:  Renee Brennan lives with her son (age 44), her adult daughter (age 7), granddaughter (age 29). She was unable to follow-up in 2-3 weeks as the schedule for all providers was full. The 5 week hiatus in between OV allowed her to stray from the eating plan and account for recent weight increase  Subjective:   1. Pre-diabetes Lab Results  Component Value Date   HGBA1C 5.7 (H) 10/30/2023   HGBA1C 5.9 (H) 04/04/2023    She endorses increased sugar/simple CHO intake the last several weeks (family celebrations and food from work). Currently her insurance will not approve GLP-1 or GIP/GLP-1 injections for weight loss  2. Vitamin D  deficiency  Latest Reference Range & Units 04/04/23 10:49  Vitamin D , 25-Hydroxy 30.0 - 100.0 ng/mL 31.4   She is on weekly Ergocalciferol - denies N/V/Muscle Weakness  5. Healthcare maintenance Only current medications are weekly Ergocalciferol  and Mirena IUD 05/29/2023- last OV with PCP  Assessment/Plan:   1. Pre-diabetes (Primary) Check Labs - Hemoglobin  A1c - Insulin , random  2. Vitamin D  deficiency Check Labs - VITAMIN D  25 Hydroxy (Vit-D Deficiency, Fractures)  5. Healthcare maintenance Check Labs - Comprehensive metabolic panel with GFR - Lipid panel  3. BMI 50.0-59.9, adult (HCC), Current BMI 53.8  Renee Brennan is currently in the action stage of change. As such, her goal is to continue with weight loss efforts. She has agreed to the Category 3 Plan.   Exercise goals: All adults should avoid inactivity. Some physical activity is better than none, and adults who participate in any amount of physical activity gain some health benefits. Adults should also include muscle-strengthening activities that involve all major muscle groups on 2 or more days a week.  Behavioral modification strategies: increasing lean protein intake, decreasing simple carbohydrates, increasing vegetables, increasing water intake, meal planning and cooking strategies, keeping healthy foods in the home, ways to avoid boredom eating, and planning for success.  Renee Brennan has agreed to follow-up with our clinic in 4 weeks. She was informed of the importance of frequent follow-up visits to maximize her success with intensive lifestyle modifications for her multiple health conditions.   Renee Brennan was informed we would discuss her lab results at her next visit unless there is a critical issue that needs to be addressed sooner. Renee Brennan agreed to keep her next visit at the agreed upon time to discuss these results.  Objective:   Blood pressure 121/75, pulse 63, temperature 98.2 F (36.8 C), height 5\' 2"  (1.575 m), weight 294  lb (133.4 kg), SpO2 99%. Body mass index is 53.77 kg/m.  General: Cooperative, alert, well developed, in no acute distress. HEENT: Conjunctivae and lids unremarkable. Cardiovascular: Regular rhythm.  Lungs: Normal work of breathing. Neurologic: No focal deficits.   Lab Results  Component Value Date   CREATININE 0.94 04/04/2023   BUN 15  04/04/2023   NA 141 04/04/2023   K 3.9 04/04/2023   CL 103 04/04/2023   CO2 24 04/04/2023   Lab Results  Component Value Date   ALT 14 04/04/2023   AST 15 04/04/2023   ALKPHOS 75 04/04/2023   BILITOT 0.8 04/04/2023   Lab Results  Component Value Date   HGBA1C 5.7 (H) 10/30/2023   HGBA1C 5.9 (H) 04/04/2023   Lab Results  Component Value Date   INSULIN  4.7 10/30/2023   INSULIN  10.4 04/04/2023   No results found for: "TSH" No results found for: "CHOL", "HDL", "LDLCALC", "LDLDIRECT", "TRIG", "CHOLHDL" Lab Results  Component Value Date   VD25OH 31.4 04/04/2023   Lab Results  Component Value Date   WBC 6.9 10/30/2023   HGB 12.5 10/30/2023   HCT 39.0 10/30/2023   MCV 90 10/30/2023   PLT 272 10/30/2023   No results found for: "IRON", "TIBC", "FERRITIN"  Attestation Statements:   Reviewed by clinician on day of visit: allergies, medications, problem list, medical history, surgical history, family history, social history, and previous encounter notes.  I have reviewed the above documentation for accuracy and completeness, and I agree with the above. -  Aaron Boeh d. Kyann Heydt, NP-C

## 2024-02-06 LAB — COMPREHENSIVE METABOLIC PANEL WITH GFR
ALT: 13 IU/L (ref 0–32)
AST: 16 IU/L (ref 0–40)
Albumin: 3.7 g/dL — ABNORMAL LOW (ref 3.8–4.9)
Alkaline Phosphatase: 90 IU/L (ref 44–121)
BUN/Creatinine Ratio: 21 (ref 9–23)
BUN: 17 mg/dL (ref 6–24)
Bilirubin Total: 0.7 mg/dL (ref 0.0–1.2)
CO2: 23 mmol/L (ref 20–29)
Calcium: 9.1 mg/dL (ref 8.7–10.2)
Chloride: 102 mmol/L (ref 96–106)
Creatinine, Ser: 0.82 mg/dL (ref 0.57–1.00)
Globulin, Total: 2.9 g/dL (ref 1.5–4.5)
Glucose: 91 mg/dL (ref 70–99)
Potassium: 4.4 mmol/L (ref 3.5–5.2)
Sodium: 140 mmol/L (ref 134–144)
Total Protein: 6.6 g/dL (ref 6.0–8.5)
eGFR: 86 mL/min/{1.73_m2} (ref >59)

## 2024-02-06 LAB — INSULIN, RANDOM: INSULIN: 8.6 u[IU]/mL (ref 2.6–24.9)

## 2024-02-06 LAB — LIPID PANEL
Chol/HDL Ratio: 2.7 ratio (ref 0.0–4.4)
Cholesterol, Total: 134 mg/dL (ref 100–199)
HDL: 49 mg/dL (ref >39)
LDL Chol Calc (NIH): 70 mg/dL (ref 0–99)
Triglycerides: 74 mg/dL (ref 0–149)
VLDL Cholesterol Cal: 15 mg/dL (ref 5–40)

## 2024-02-06 LAB — HEMOGLOBIN A1C
Est. average glucose Bld gHb Est-mCnc: 120 mg/dL
Hgb A1c MFr Bld: 5.8 % — ABNORMAL HIGH (ref 4.8–5.6)

## 2024-02-06 LAB — VITAMIN D 25 HYDROXY (VIT D DEFICIENCY, FRACTURES): Vit D, 25-Hydroxy: 48 ng/mL (ref 30.0–100.0)

## 2024-02-15 ENCOUNTER — Other Ambulatory Visit (INDEPENDENT_AMBULATORY_CARE_PROVIDER_SITE_OTHER): Payer: Self-pay | Admitting: Family Medicine

## 2024-02-15 DIAGNOSIS — E559 Vitamin D deficiency, unspecified: Secondary | ICD-10-CM

## 2024-02-19 ENCOUNTER — Encounter (INDEPENDENT_AMBULATORY_CARE_PROVIDER_SITE_OTHER): Payer: Self-pay | Admitting: Family Medicine

## 2024-02-19 ENCOUNTER — Ambulatory Visit (INDEPENDENT_AMBULATORY_CARE_PROVIDER_SITE_OTHER): Admitting: Family Medicine

## 2024-02-19 VITALS — BP 95/66 | HR 81 | Temp 98.0°F | Ht 62.0 in | Wt 293.0 lb

## 2024-02-19 DIAGNOSIS — E66813 Obesity, class 3: Secondary | ICD-10-CM

## 2024-02-19 DIAGNOSIS — E669 Obesity, unspecified: Secondary | ICD-10-CM | POA: Diagnosis not present

## 2024-02-19 DIAGNOSIS — R7303 Prediabetes: Secondary | ICD-10-CM

## 2024-02-19 DIAGNOSIS — Z6841 Body Mass Index (BMI) 40.0 and over, adult: Secondary | ICD-10-CM | POA: Diagnosis not present

## 2024-02-19 DIAGNOSIS — E559 Vitamin D deficiency, unspecified: Secondary | ICD-10-CM | POA: Diagnosis not present

## 2024-02-19 MED ORDER — VITAMIN D (ERGOCALCIFEROL) 1.25 MG (50000 UNIT) PO CAPS: 50000.0000 [IU] | ORAL_CAPSULE | ORAL | 0 refills | Status: DC

## 2024-02-19 NOTE — Progress Notes (Signed)
 Office: [phone]  /  Fax: [phone]  WEIGHT SUMMARY AND BIOMETRICS  Anthropometric Measurements Height: 5\' 2"  (1.575 m) Weight: 293 lb (132.9 kg) BMI (Calculated): 53.58 Weight at Last Visit: 294 lb Weight Lost Since Last Visit: 1 lb Weight Gained Since Last Visit: 0 Starting Weight: 330 lb Total Weight Loss (lbs): 37 lb (16.8 kg) Peak Weight: 347 lb   Body Composition  Body Fat %: 56.8 % Fat Mass (lbs): 166.8 lbs Muscle Mass (lbs): 120.6 lbs Visceral Fat Rating : 22   Other Clinical Data Fasting: yes Labs: no Today's Visit #: 13 Starting Date: 04/04/23    Chief Complaint: OBESITY    History of Present Illness Renee Brennan is a 53 year old female with obesity and prediabetes who presents for obesity treatment and progress assessment.  She is adhering to a category three eating plan about 60% of the time and engages in physical activity by walking for 20 minutes, five times a week, resulting in a one-pound weight loss over the last two weeks. She experiences fluctuating hunger levels, particularly struggling with hunger during the day after a good breakfast. She identifies as an emotional eater, which impacts her eating habits, especially when she is very hungry after work. Her daughter cooks healthy meals for her, but she sometimes opts for fast food due to hunger.  She has a history of prediabetes and is working on improving her condition through diet, exercise, and weight loss. Her most recent hemoglobin A1c was 5.8. She is not currently on medications for prediabetes. She has previously used Ozempic but experienced weight gain after stopping it. She is trying to manage her condition without medication at this time.  Her stress levels have been stable, and her work schedule impacts her meal timing, with lunch at 8:30 PM and sometimes skipping breaks due to work demands. She has a supportive family environment, with her daughter assisting in meal  preparation.      PHYSICAL EXAM:  Blood pressure 95/66, pulse 81, temperature 98 F (36.7 C), height 5\' 2"  (1.575 m), weight 293 lb (132.9 kg), SpO2 98%. Body mass index is 53.59 kg/m.  DIAGNOSTIC DATA REVIEWED:  BMET    Component Value Date/Time   NA 140 02/05/2024 1033   K 4.4 02/05/2024 1033   CL 102 02/05/2024 1033   CO2 23 02/05/2024 1033   GLUCOSE 91 02/05/2024 1033   GLUCOSE 84 12/03/2019 1006   BUN 17 02/05/2024 1033   CREATININE 0.82 02/05/2024 1033   CALCIUM 9.1 02/05/2024 1033   GFRNONAA >60 12/03/2019 1006   GFRAA >60 12/03/2019 1006   Lab Results  Component Value Date   HGBA1C 5.8 (H) 02/05/2024   HGBA1C 5.9 (H) 04/04/2023   Lab Results  Component Value Date   INSULIN  8.6 02/05/2024   INSULIN  10.4 04/04/2023   No results found for: "TSH" CBC    Component Value Date/Time   WBC 6.9 10/30/2023 1142   RBC 4.32 10/30/2023 1142   HGB 12.5 10/30/2023 1142   HCT 39.0 10/30/2023 1142   PLT 272 10/30/2023 1142   MCV 90 10/30/2023 1142   MCH 28.9 10/30/2023 1142   MCHC 32.1 10/30/2023 1142   RDW 12.5 10/30/2023 1142   Iron Studies No results found for: "IRON", "TIBC", "FERRITIN", "IRONPCTSAT" Lipid Panel     Component Value Date/Time   CHOL 134 02/05/2024 1033   TRIG 74 02/05/2024 1033   HDL 49 02/05/2024 1033   CHOLHDL 2.7 02/05/2024 1033   LDLCALC  70 02/05/2024 1033   Hepatic Function Panel     Component Value Date/Time   PROT 6.6 02/05/2024 1033   ALBUMIN 3.7 (L) 02/05/2024 1033   AST 16 02/05/2024 1033   ALT 13 02/05/2024 1033   ALKPHOS 90 02/05/2024 1033   BILITOT 0.7 02/05/2024 1033   No results found for: "TSH" Nutritional Lab Results  Component Value Date   VD25OH 48.0 02/05/2024   VD25OH 31.4 04/04/2023     Assessment and Plan Assessment & Plan Obesity Obesity management with a category three eating plan, adherence at 60%. Engages in physical activity by walking 20 minutes five times a week. Weight loss of one pound in  the last two weeks. Experiences variable hunger and challenges in meal timing due to work schedule. Emotional eating is present. Prefers to avoid medication, despite previous use of Ozempic, which led to weight gain after discontinuation. Discussed potential use of metformin as a non-injection option that maintains appropriate hunger signals without complete suppression. - Continue category three eating plan. - Continue walking 20 minutes five times a week. - Consider metformin if dietary and exercise measures are insufficient. - Follow up in 2-3 weeks to reassess weight management and dietary adherence.  Prediabetes Prediabetes managed with diet, exercise, and weight loss. Current hemoglobin A1c is 5.8, with a goal of 5.5. Previous A1c was 5.7, indicating a slight increase. Fasting insulin  levels are below 10, indicating improved pancreatic function. Discussed the role of metformin in managing prediabetes alongside lifestyle modifications. - Monitor hemoglobin A1c and fasting insulin  levels. - Consider metformin if lifestyle modifications are insufficient. - Follow up in 2-3 weeks to reassess glycemic control.  Vitamin D  deficiency Vitamin D  levels have improved from 31 to 48, with a goal of 50. Continued supplementation is necessary to reach target levels. - Refill vitamin D  prescription. - Encourage continued vitamin D  supplementation.   She was informed of the importance of frequent follow up visits to maximize her success with intensive lifestyle modifications for her multiple health conditions.    Jasmine Mesi, MD

## 2024-03-14 ENCOUNTER — Other Ambulatory Visit (INDEPENDENT_AMBULATORY_CARE_PROVIDER_SITE_OTHER): Payer: Self-pay | Admitting: Family Medicine

## 2024-03-14 DIAGNOSIS — E559 Vitamin D deficiency, unspecified: Secondary | ICD-10-CM

## 2024-03-26 ENCOUNTER — Encounter (INDEPENDENT_AMBULATORY_CARE_PROVIDER_SITE_OTHER): Payer: Self-pay | Admitting: Family Medicine

## 2024-03-26 ENCOUNTER — Ambulatory Visit (INDEPENDENT_AMBULATORY_CARE_PROVIDER_SITE_OTHER): Admitting: Family Medicine

## 2024-03-26 VITALS — BP 99/74 | HR 75 | Temp 98.6°F | Ht 62.0 in | Wt 294.0 lb

## 2024-03-26 DIAGNOSIS — E559 Vitamin D deficiency, unspecified: Secondary | ICD-10-CM | POA: Diagnosis not present

## 2024-03-26 DIAGNOSIS — Z6841 Body Mass Index (BMI) 40.0 and over, adult: Secondary | ICD-10-CM

## 2024-03-26 DIAGNOSIS — E669 Obesity, unspecified: Secondary | ICD-10-CM

## 2024-03-26 MED ORDER — VITAMIN D (ERGOCALCIFEROL) 1.25 MG (50000 UNIT) PO CAPS: 50000.0000 [IU] | ORAL_CAPSULE | ORAL | 0 refills | Status: DC

## 2024-03-26 NOTE — Progress Notes (Signed)
 Renee Brennan, D.O.  ABFM, ABOM Specializing in Clinical Bariatric Medicine  Office located at: [address] [place], [place]  [address]   Assessment and Plan:  No orders of the defined types were placed in this encounter.  Medications Discontinued During This Encounter  Medication Reason   Vitamin D , Ergocalciferol , (DRISDOL ) 1.25 MG (50000 UNIT) CAPS capsule Reorder     Meds ordered this encounter  Medications   Vitamin D , Ergocalciferol , (DRISDOL ) 1.25 MG (50000 UNIT) CAPS capsule    Sig: Take 1 capsule (50,000 Units total) by mouth every 7 (seven) days.    Dispense:  8 capsule    Refill:  0     FOR THE DISEASE OF OBESITY: Obesity, starting BMI 60.36 BMI 50.0-59.9, adult  -- Current BMI 53.76 Assessment & Plan: Since last office visit with Dr. Verdon on 5/20 patient's muscle mass has increased by 1.8 lbs. Fat mass has decreased by 1.8 lbs. Counseling done on how various foods will affect these numbers and how to maximize success  Total lbs lost to date: 36 lbs  Total weight loss percentage to date: -10.91%    Recommended Dietary Goals Brigetta is currently in the action stage of change. As such, her goal is to continue weight management plan.  She has agreed to: continue current plan   Behavioral Intervention We discussed the following today: increasing lean protein intake to established goals, work on meal planning and preparation, identifying sources and decreasing liquid calories, decreasing eating out or consumption of processed foods, and making healthy choices when eating convenient foods, and focusing on food with a 10:1 ratio of calories: grams of protein. Encouraged pt to move her protein intake around throughout the day if unable to eat it all at each meal, to ensure she is meeting her protein intake goals.  Additional resources provided today: Handout on CAT 3-4 lunch options and Handout on Healthy Tuna Salad Recipe   Evidence-based  interventions for health behavior change were utilized today including the discussion of self monitoring techniques, problem-solving barriers and SMART goal setting techniques.   Regarding patient's less desirable eating habits and patterns, we employed the technique of small changes.   Pt will specifically work on: Meal prepping and planning    Recommended Physical Activity Goals [name] has been advised to work up to 300-450 minutes of moderate intensity aerobic activity a week and strengthening exercises 2-3 times per week for cardiovascular health, weight loss maintenance and preservation of muscle mass.   She has agreed to: Continue current level of physical activity    Pharmacotherapy We both agreed to: Continue with current nutritional and behavioral strategies   ASSOCIATED CONDITIONS ADDRESSED TODAY: Vitamin D  deficiency Assessment & Plan: Lab Results  Component Value Date   VD25OH 48.0 02/05/2024   VD25OH 31.4 04/04/2023   She is typically compliant with ERGO 50K units once weekly, however, she ran out and has missed one dose. While taking, she was able to notice improvements with her mood and energy levels. Continue with supplementation; will refill ERGO today.  Orders: - Refill ERGO, no changes    Follow up:   Return in about 4 weeks (around 04/23/2024) for 4-6 weeks follow up. She was informed of the importance of frequent follow up visits to maximize her success with intensive lifestyle modifications for her multiple health conditions.  Subjective:   Chief complaint: Obesity Renee Brennan is here to discuss her progress with her obesity treatment plan. She is on the Category 3  Plan and states she is following her eating plan approximately 60% of the time. She states she is walking 30 minutes 5 days per week.  Interval History:  JOBY Brennan is here for a follow up office visit. Since last OV with Dr. Verdon on 5/20, she is up 1 lb. She identifies struggling  with following her meal plan most after work and on the weekends. Pt works 2nd shift and starts work at 3:30 pm and tends to leave whenever she is done with the work, which ranges from 11:30pm to as late as 3am. She eats breakfast around 9:30AM-11AM and drinks a Muscle Milk protein shake for lunch before work. She eats dinner while at work, at [employer]. She either eats a frozen meal or a pre-made salad for dinner, and always pairs either option with an apple and a yogurt. By the time she gets off of work and arrives home, she tends to feel like she will crash and experiences increased hunger/cravings. She adds that her job at times rewards the workers with food, which his typically off-plan foods and are tempting to her.   Pharmacotherapy for weight loss: She is currently taking no anti-obesity medication.   Review of Systems:  Pertinent positives were addressed with patient today.  Reviewed by clinician on day of visit: allergies, medications, problem list, medical history, surgical history, family history, social history, and previous encounter notes.  Weight Summary and Biometrics   Weight Lost Since Last Visit: 0lb  Weight Gained Since Last Visit: 1lb    Vitals Temp: 98.6 F (37 C) BP: 99/74 Pulse Rate: 75 SpO2: 97 %   Anthropometric Measurements Height: 5' 2 (1.575 m) Weight: 294 lb (133.4 kg) BMI (Calculated): 53.76 Weight at Last Visit: 293lb Weight Lost Since Last Visit: 0lb Weight Gained Since Last Visit: 1lb Starting Weight: 330lb Total Weight Loss (lbs): 36 lb (16.3 kg) Peak Weight: 347lb   Body Composition  Body Fat %: 56.1 % Fat Mass (lbs): 165 lbs Muscle Mass (lbs): 122.4 lbs Total Body Water (lbs): 97.2 lbs Visceral Fat Rating : 22   Other Clinical Data Fasting: No Labs: no Today's Visit #: 14 Starting Date: 04/04/23    Objective:   PHYSICAL EXAM: Blood pressure 99/74, pulse 75, temperature 98.6 F (37 C), height 5' 2 (1.575 m), weight 294 lb  (133.4 kg), SpO2 97%. Body mass index is 53.77 kg/m.  General: she is overweight, cooperative and in no acute distress. PSYCH: Has normal mood, affect and thought process.   HEENT: EOMI, sclerae are anicteric. Lungs: Normal breathing effort, no conversational dyspnea. Extremities: Moves * 4 Neurologic: A and O * 3, good insight  DIAGNOSTIC DATA REVIEWED: BMET    Component Value Date/Time   NA 140 02/05/2024 1033   K 4.4 02/05/2024 1033   CL 102 02/05/2024 1033   CO2 23 02/05/2024 1033   GLUCOSE 91 02/05/2024 1033   GLUCOSE 84 12/03/2019 1006   BUN 17 02/05/2024 1033   CREATININE 0.82 02/05/2024 1033   CALCIUM 9.1 02/05/2024 1033   GFRNONAA >60 12/03/2019 1006   GFRAA >60 12/03/2019 1006   Lab Results  Component Value Date   HGBA1C 5.8 (H) 02/05/2024   HGBA1C 5.9 (H) 04/04/2023   Lab Results  Component Value Date   INSULIN  8.6 02/05/2024   INSULIN  10.4 04/04/2023   No results found for: TSH CBC    Component Value Date/Time   WBC 6.9 10/30/2023 1142   RBC 4.32 10/30/2023 1142   HGB 12.5  10/30/2023 1142   HCT 39.0 10/30/2023 1142   PLT 272 10/30/2023 1142   MCV 90 10/30/2023 1142   MCH 28.9 10/30/2023 1142   MCHC 32.1 10/30/2023 1142   RDW 12.5 10/30/2023 1142   Iron Studies No results found for: IRON, TIBC, FERRITIN, IRONPCTSAT Lipid Panel     Component Value Date/Time   CHOL 134 02/05/2024 1033   TRIG 74 02/05/2024 1033   HDL 49 02/05/2024 1033   CHOLHDL 2.7 02/05/2024 1033   LDLCALC 70 02/05/2024 1033   Hepatic Function Panel     Component Value Date/Time   PROT 6.6 02/05/2024 1033   ALBUMIN 3.7 (L) 02/05/2024 1033   AST 16 02/05/2024 1033   ALT 13 02/05/2024 1033   ALKPHOS 90 02/05/2024 1033   BILITOT 0.7 02/05/2024 1033   No results found for: TSH Nutritional Lab Results  Component Value Date   VD25OH 48.0 02/05/2024   VD25OH 31.4 04/04/2023    Attestations:   I, Vernell Forest, acting as a medical scribe for Renee Jenkins, DO., have compiled all relevant documentation for today's office visit on behalf of Renee Jenkins, DO, while in the presence of [employer], DO.   I have reviewed the above documentation for accuracy and completeness, and I agree with the above. Renee JINNY Brennan, D.O.  The 21st Century Cures Act was signed into law in 2016 which includes the topic of electronic health records.  This provides immediate access to information in MyChart.  This includes consultation notes, operative notes, office notes, lab results and pathology reports.  If you have any questions about what you read please let us  know at your next visit so we can discuss your concerns and take corrective action if need be.  We are right here with you.

## 2024-04-30 ENCOUNTER — Ambulatory Visit (INDEPENDENT_AMBULATORY_CARE_PROVIDER_SITE_OTHER): Admitting: Family Medicine

## 2024-04-30 ENCOUNTER — Encounter (INDEPENDENT_AMBULATORY_CARE_PROVIDER_SITE_OTHER): Payer: Self-pay | Admitting: Family Medicine

## 2024-04-30 VITALS — BP 105/72 | HR 74 | Temp 98.3°F | Ht 62.0 in | Wt 290.0 lb

## 2024-04-30 DIAGNOSIS — E669 Obesity, unspecified: Secondary | ICD-10-CM

## 2024-04-30 DIAGNOSIS — Z6841 Body Mass Index (BMI) 40.0 and over, adult: Secondary | ICD-10-CM | POA: Diagnosis not present

## 2024-04-30 DIAGNOSIS — E559 Vitamin D deficiency, unspecified: Secondary | ICD-10-CM | POA: Diagnosis not present

## 2024-04-30 NOTE — Progress Notes (Signed)
 Renee Brennan, D.O.  ABFM, ABOM Specializing in Clinical Bariatric Medicine  Office located at: [address] [place], [place]  [address]   Assessment and Plan:   FOR THE DISEASE OF OBESITY: Obesity, starting BMI 60.36 BMI 50.0-59.9, adult  -- Current BMI 53.04 Assessment & Plan: Since last office visit on 03/26/24 patient's muscle mass has decreased by 3 lbs. Fat mass has decreased by 0.2 lbs. Body fat % has increased by 0.6%. Total body water not obtained today. Counseling done on how various foods will affect these numbers and how to maximize success  Total lbs lost to date: 40 lbs Total weight loss percentage to date: -12.12 %   Recommended Dietary Goals Sabre is currently in the action stage of change. As such, her goal is to continue weight management plan.  She has agreed to: continue current plan   Behavioral Intervention We discussed the following today at length: increasing lean protein intake to established goals, decreasing eating out or consumption of processed foods, and making healthy choices when eating convenient foods, avoiding temptations and identifying enticing environmental cues, staying on track while traveling and vacationing, focusing on food with a 10:1 ratio of calories: grams of protein, and using GPT or another AI platform for recipe ideas- searching low calorie, low carb, high protein chicken recipes etc, strategies for following meal plan when planning to attend social gatherings/events, and how to find healthier alternatives to make her favorite foods.  Additional resources provided today: Handout on Example of Chat GPT [place] inspired recipes and Handout on [employer] AI Healthy Gelato Recipe  Evidence-based interventions for health behavior change were utilized today including the discussion of self monitoring techniques, problem-solving barriers and SMART goal setting techniques.   Regarding patient's less desirable eating habits and  patterns, we employed the technique of small changes.   Pt will specifically work on: eating on plan when traveling to see family in [place].    Recommended Physical Activity Goals Dusty has been advised to work up to 300-450 minutes of moderate intensity aerobic activity a week and strengthening exercises 2-3 times per week for cardiovascular health, weight loss maintenance and preservation of muscle mass.   She has agreed to: Continue current level of physical activity , Increase physical activity in their day and reduce sedentary time (increase NEAT)., and gradually increase intensity and consider adding strength training in the future.    Pharmacotherapy We both agreed to: Continue with current nutritional and behavioral strategies   ASSOCIATED CONDITIONS ADDRESSED TODAY:  Vitamin D  deficiency Assessment & Plan: Lab Results  Component Value Date   VD25OH 48.0 02/05/2024   VD25OH 31.4 04/04/2023   Reviewed last obtained labs: vit D was slightly below goal of 50-70. Compliant with ERGO 50K units once weekly. Tolerating well, no SE. NO acute concerns. Pt states she does not require a refill today. Continue with current supplementation as prescribed. Will recheck Vit D periodically, 3-4 months from last obtained.    Follow up:   Return in about 4 weeks (around 05/28/2024) for 3-4 week follow up.  She was informed of the importance of frequent follow up visits to maximize her success with intensive lifestyle modifications for her multiple health conditions.  Subjective:   Chief complaint: Obesity [name] is here to discuss her progress with her obesity treatment plan. She is on the Category 3 Plan and states she is following her eating plan approximately 60% of the time. She states she is walking 30 minutes 5  days per week.   Interval History:  [name] is here for a follow up office visit. Since last OV on 03/26/24, she is down 4 lbs. She tends to do grocery shopping on  weekdays only and stays indoors on weekends. She sometimes orders take out, but tries to order healthier options such as a sandwich on wheat bread from [employer]. She also enjoys Asian food and on occasion orders [place] food as well.   Pharmacotherapy that aid with weight loss: She is currently taking no anti-obesity medication.    Review of Systems:  Pertinent positives were addressed with patient today.  Reviewed by clinician on day of visit: allergies, medications, problem list, medical history, surgical history, family history, social history, and previous encounter notes.  Weight Summary and Biometrics   Weight Lost Since Last Visit: 4lb  Weight Gained Since Last Visit: 0lb   Vitals Temp: 98.3 F (36.8 C) BP: 105/72 Pulse Rate: 74 SpO2: 99 %   Anthropometric Measurements Height: 5' 2 (1.575 m) Weight: 290 lb (131.5 kg) BMI (Calculated): 53.03 Weight at Last Visit: 294lb Weight Lost Since Last Visit: 4lb Weight Gained Since Last Visit: 0lb Starting Weight: 330lb Total Weight Loss (lbs): 40 lb (18.1 kg) Peak Weight: 347lb   Body Composition  Body Fat %: 56.7 % Fat Mass (lbs): 164.8 lbs Muscle Mass (lbs): 119.4 lbs Visceral Fat Rating : 22   Other Clinical Data Fasting: Yes Labs: No Today's Visit #: 15 Starting Date: 04/04/23 Comments: Cat 3    Objective:   PHYSICAL EXAM: Blood pressure 105/72, pulse 74, temperature 98.3 F (36.8 C), height 5' 2 (1.575 m), weight 290 lb (131.5 kg), SpO2 99%. Body mass index is 53.04 kg/m.  General: she is overweight, cooperative and in no acute distress. PSYCH: Has normal mood, affect and thought process.   HEENT: EOMI, sclerae are anicteric. Lungs: Normal breathing effort, no conversational dyspnea. Extremities: Moves * 4 Neurologic: A and O * 3, good insight  DIAGNOSTIC DATA REVIEWED: BMET    Component Value Date/Time   NA 140 02/05/2024 1033   K 4.4 02/05/2024 1033   CL 102 02/05/2024 1033   CO2 23  02/05/2024 1033   GLUCOSE 91 02/05/2024 1033   GLUCOSE 84 12/03/2019 1006   BUN 17 02/05/2024 1033   CREATININE 0.82 02/05/2024 1033   CALCIUM 9.1 02/05/2024 1033   GFRNONAA >60 12/03/2019 1006   GFRAA >60 12/03/2019 1006   Lab Results  Component Value Date   HGBA1C 5.8 (H) 02/05/2024   HGBA1C 5.9 (H) 04/04/2023   Lab Results  Component Value Date   INSULIN  8.6 02/05/2024   INSULIN  10.4 04/04/2023   No results found for: TSH CBC    Component Value Date/Time   WBC 6.9 10/30/2023 1142   RBC 4.32 10/30/2023 1142   HGB 12.5 10/30/2023 1142   HCT 39.0 10/30/2023 1142   PLT 272 10/30/2023 1142   MCV 90 10/30/2023 1142   MCH 28.9 10/30/2023 1142   MCHC 32.1 10/30/2023 1142   RDW 12.5 10/30/2023 1142   Iron Studies No results found for: IRON, TIBC, FERRITIN, IRONPCTSAT Lipid Panel     Component Value Date/Time   CHOL 134 02/05/2024 1033   TRIG 74 02/05/2024 1033   HDL 49 02/05/2024 1033   CHOLHDL 2.7 02/05/2024 1033   LDLCALC 70 02/05/2024 1033   Hepatic Function Panel     Component Value Date/Time   PROT 6.6 02/05/2024 1033   ALBUMIN 3.7 (L) 02/05/2024 1033  AST 16 02/05/2024 1033   ALT 13 02/05/2024 1033   ALKPHOS 90 02/05/2024 1033   BILITOT 0.7 02/05/2024 1033   No results found for: TSH Nutritional Lab Results  Component Value Date   VD25OH 48.0 02/05/2024   VD25OH 31.4 04/04/2023    Attestations:   I, Vernell Forest, acting as a medical scribe for Renee Jenkins, DO., have compiled all relevant documentation for today's office visit on behalf of Renee Jenkins, DO, while in the presence of [employer], DO.  I have reviewed the above documentation for accuracy and completeness, and I agree with the above. Renee JINNY Brennan, D.O.  The 21st Century Cures Act was signed into law in 2016 which includes the topic of electronic health records.  This provides immediate access to information in MyChart.  This includes consultation notes,  operative notes, office notes, lab results and pathology reports.  If you have any questions about what you read please let us  know at your next visit so we can discuss your concerns and take corrective action if need be.  We are right here with you.

## 2024-05-17 ENCOUNTER — Other Ambulatory Visit (INDEPENDENT_AMBULATORY_CARE_PROVIDER_SITE_OTHER): Payer: Self-pay | Admitting: Family Medicine

## 2024-05-17 DIAGNOSIS — E559 Vitamin D deficiency, unspecified: Secondary | ICD-10-CM

## 2024-05-28 ENCOUNTER — Encounter (INDEPENDENT_AMBULATORY_CARE_PROVIDER_SITE_OTHER): Payer: Self-pay | Admitting: Family Medicine

## 2024-05-28 ENCOUNTER — Ambulatory Visit (INDEPENDENT_AMBULATORY_CARE_PROVIDER_SITE_OTHER): Admitting: Family Medicine

## 2024-05-28 VITALS — BP 107/76 | HR 74 | Temp 98.1°F | Ht 62.0 in | Wt 297.0 lb

## 2024-05-28 DIAGNOSIS — F39 Unspecified mood [affective] disorder: Secondary | ICD-10-CM

## 2024-05-28 DIAGNOSIS — E559 Vitamin D deficiency, unspecified: Secondary | ICD-10-CM

## 2024-05-28 DIAGNOSIS — E66813 Obesity, class 3: Secondary | ICD-10-CM | POA: Diagnosis not present

## 2024-05-28 DIAGNOSIS — Z6841 Body Mass Index (BMI) 40.0 and over, adult: Secondary | ICD-10-CM

## 2024-05-28 MED ORDER — VITAMIN D (ERGOCALCIFEROL) 1.25 MG (50000 UNIT) PO CAPS
50000.0000 [IU] | ORAL_CAPSULE | ORAL | 0 refills | Status: DC
Start: 2024-05-28 — End: 2024-08-20

## 2024-05-28 NOTE — Progress Notes (Signed)
 Renee DOROTHA Brennan, D.O.  ABFM, ABOM Specializing in Clinical Bariatric Medicine  Office located at: [address] [place], [place]  [address]     Assessment and Plan:   Medications Discontinued During This Encounter  Medication Reason   Vitamin D , Ergocalciferol , (DRISDOL ) 1.25 MG (50000 UNIT) CAPS capsule Reorder    Meds ordered this encounter  Medications   Vitamin D , Ergocalciferol , (DRISDOL ) 1.25 MG (50000 UNIT) CAPS capsule    Sig: Take 1 capsule (50,000 Units total) by mouth every 7 (seven) days.    Dispense:  12 capsule    Refill:  0      FOR THE DISEASE OF OBESITY:  Obesity, starting BMI 60.36 BMI 50.0-59.9, adult  -- Current BMI 54.31 Assessment & Plan: Since last office visit on 04/30/24 patient's muscle mass has increased by 1.6 lbs. Fat mass has increased by 4.8 lbs. Total body water has increased by 1.6 lbs.  Body fat % has increased by 0.4  %. Counseling done on how various foods will affect these numbers and how to maximize success  Total lbs lost to date: 33 lbs Total weight loss percentage to date: -10.00 %   Recommended Dietary Goals Renee Brennan is currently in the action stage of change. As such, her goal is to continue weight management plan.  She has agreed to: continue current plan   Behavioral Intervention We discussed the following today: avoiding skipping meals and work on managing stress, creating time for self-care and relaxation, focus on her mental health and being her best self while navigating her current situation with her mother's condition. Work on improving sleep hygiene, aiming for 7-9 hours of sleep per night. Try 4-7-8 breathing exercises and other meditations.   Additional resources provided today: None  Evidence-based interventions for health behavior change were utilized today including the discussion of self monitoring techniques, problem-solving barriers and SMART goal setting techniques.   Regarding patient's less  desirable eating habits and patterns, we employed the technique of small changes.   Pt will specifically work on: prioritize her mental health with stress management strategies.    Recommended Physical Activity Goals Renee Brennan has been advised to work up to 300-450 minutes of moderate intensity aerobic activity a week and strengthening exercises 2-3 times per week for cardiovascular health, weight loss maintenance and preservation of muscle mass.   She has agreed to: Continue current level of physical activity    Pharmacotherapy We both agreed to: Continue with current nutritional and behavioral strategies   ASSOCIATED CONDITIONS ADDRESSED TODAY:  Mood disorder ***  Assessment & Plan: Pt reports increased stress due to her mother's recent health decline. Her mother has stage 4 ovarian cancer; was diagnosed in 2022, and currently is hospitalized for COVID. She endorses anticipatory grief and anxiety associated to being uncertain when she will have to travel again. No personal hx of depression.   Extensive counseling and encouragement for pt to prioritize her mental health. Reviewed stress management strategies including walking 30 minutes 7 days for mental well being, 4-7-8 breathing for 4 minutes in the morning and at night, and improving sleep hygiene. Stressed the importance adequate sleep, aiming for 7-9 hours/night. Avoid skipping meals. Recommend pt seek counselor, hospice grief counselor, or EAP through her employee. Encouraged additional personal research online for support groups and strategies if pt desires. Will continue monitoring condition.     Vitamin D  deficiency Assessment & Plan: Lab Results  Component Value Date   VD25OH 48.0 02/05/2024   VD25OH  31.4 04/04/2023   Reviewed last obtained labs: Vit D below goal at 48 in 01/2024. Pt has not been taking ERGO for the past 3 weeks. She is prescribed ERGO 50K units once weekly.   Continue current supplementation. Will refill  ERGO today, no dose changes. Will continue monitoring.     Follow up:   Return for f/u on 07/23/2024 at 10:00 AM. She was informed of the importance of frequent follow up visits to maximize her success with intensive lifestyle modifications for her multiple health conditions.   Subjective:   Chief complaint: Obesity Renee Brennan is here to discuss her progress with her obesity treatment plan. She is on the Category 3 Plan and states she is following her eating plan approximately 40% of the time. She states she is walking 30 minutes 5 days per week.   Interval History:  Renee Brennan is here for a follow up office visit. Since last OV on 04/30/24, she is down 7 lbs. States she has struggled with following her meal plan as she recently has had to travel to visit her mother with stage IV ovarian cancer. States she has anxiety waiting for a call that she needs to travel to [place] to visit her mother. This has made it difficult to prioritize her own health and eat healthy.    Pharmacotherapy that aid with weight loss: She is currently taking no anti-obesity medication.    Review of Systems:  Pertinent positives were addressed with patient today.  Reviewed by clinician on day of visit: allergies, medications, problem list, medical history, surgical history, family history, social history, and previous encounter notes.  Weight Summary and Biometrics   Weight Lost Since Last Visit: 0lb  Weight Gained Since Last Visit: 7lb    Vitals Temp: 98.1 F (36.7 C) BP: 107/76 Pulse Rate: 74 SpO2: 96 %   Anthropometric Measurements Height: 5' 2 (1.575 m) Weight: 297 lb (134.7 kg) BMI (Calculated): 54.31 Weight at Last Visit: 290lb Weight Lost Since Last Visit: 0lb Weight Gained Since Last Visit: 7lb Starting Weight: 330lb Total Weight Loss (lbs): 33 lb (15 kg) Peak Weight: 347lb   Body Composition  Body Fat %: 57.1 % Fat Mass (lbs): 169.6 lbs Muscle Mass (lbs): 121  lbs Visceral Fat Rating : 23   Other Clinical Data Fasting: no Labs: no Today's Visit #: 16 Starting Date: 04/04/23    Objective:   PHYSICAL EXAM: Blood pressure 107/76, pulse 74, temperature 98.1 F (36.7 C), height 5' 2 (1.575 m), weight 297 lb (134.7 kg), SpO2 96%. Body mass index is 54.32 kg/m.  General: she is overweight, cooperative and in no acute distress. PSYCH: Has normal mood, affect and thought process.   HEENT: EOMI, sclerae are anicteric. Lungs: Normal breathing effort, no conversational dyspnea. Extremities: Moves * 4 Neurologic: A and O * 3, good insight  DIAGNOSTIC DATA REVIEWED:  BMET    Component Value Date/Time   NA 140 02/05/2024 1033   K 4.4 02/05/2024 1033   CL 102 02/05/2024 1033   CO2 23 02/05/2024 1033   GLUCOSE 91 02/05/2024 1033   GLUCOSE 84 12/03/2019 1006   BUN 17 02/05/2024 1033   CREATININE 0.82 02/05/2024 1033   CALCIUM 9.1 02/05/2024 1033   GFRNONAA >60 12/03/2019 1006   GFRAA >60 12/03/2019 1006   Lab Results  Component Value Date   HGBA1C 5.8 (H) 02/05/2024   HGBA1C 5.9 (H) 04/04/2023   Lab Results  Component Value Date   INSULIN  8.6 02/05/2024  INSULIN  10.4 04/04/2023   No results found for: TSH  CBC    Component Value Date/Time   WBC 6.9 10/30/2023 1142   RBC 4.32 10/30/2023 1142   HGB 12.5 10/30/2023 1142   HCT 39.0 10/30/2023 1142   PLT 272 10/30/2023 1142   MCV 90 10/30/2023 1142   MCH 28.9 10/30/2023 1142   MCHC 32.1 10/30/2023 1142   RDW 12.5 10/30/2023 1142   Iron Studies No results found for: IRON, TIBC, FERRITIN, IRONPCTSAT  Lipid Panel     Component Value Date/Time   CHOL 134 02/05/2024 1033   TRIG 74 02/05/2024 1033   HDL 49 02/05/2024 1033   CHOLHDL 2.7 02/05/2024 1033   LDLCALC 70 02/05/2024 1033   Hepatic Function Panel     Component Value Date/Time   PROT 6.6 02/05/2024 1033   ALBUMIN 3.7 (L) 02/05/2024 1033   AST 16 02/05/2024 1033   ALT 13 02/05/2024 1033   ALKPHOS  90 02/05/2024 1033   BILITOT 0.7 02/05/2024 1033   No results found for: TSH  Nutritional Lab Results  Component Value Date   VD25OH 48.0 02/05/2024   VD25OH 31.4 04/04/2023    Attestations:   I, Vernell Forest, acting as a medical scribe for Renee Jenkins, DO., have compiled all relevant documentation for today's office visit on behalf of Renee Jenkins, DO, while in the presence of [employer], DO.  I have reviewed the above documentation for accuracy and completeness, and I agree with the above. Renee Brennan, D.O.  The 21st Century Cures Act was signed into law in 2016 which includes the topic of electronic health records.  This provides immediate access to information in MyChart.  This includes consultation notes, operative notes, office notes, lab results and pathology reports.  If you have any questions about what you read please let us  know at your next visit so we can discuss your concerns and take corrective action if need be.  We are right here with you.

## 2024-06-10 DIAGNOSIS — E538 Deficiency of other specified B group vitamins: Secondary | ICD-10-CM | POA: Diagnosis not present

## 2024-06-10 DIAGNOSIS — R7303 Prediabetes: Secondary | ICD-10-CM | POA: Diagnosis not present

## 2024-06-10 DIAGNOSIS — E559 Vitamin D deficiency, unspecified: Secondary | ICD-10-CM | POA: Diagnosis not present

## 2024-06-10 DIAGNOSIS — Z6841 Body Mass Index (BMI) 40.0 and over, adult: Secondary | ICD-10-CM | POA: Diagnosis not present

## 2024-07-23 ENCOUNTER — Ambulatory Visit (INDEPENDENT_AMBULATORY_CARE_PROVIDER_SITE_OTHER): Admitting: Family Medicine

## 2024-07-23 VITALS — BP 101/68 | HR 67 | Temp 98.1°F | Ht 62.0 in | Wt 302.0 lb

## 2024-07-23 DIAGNOSIS — E65 Localized adiposity: Secondary | ICD-10-CM

## 2024-07-23 DIAGNOSIS — E669 Obesity, unspecified: Secondary | ICD-10-CM | POA: Diagnosis not present

## 2024-07-23 DIAGNOSIS — E559 Vitamin D deficiency, unspecified: Secondary | ICD-10-CM

## 2024-07-23 DIAGNOSIS — Z6841 Body Mass Index (BMI) 40.0 and over, adult: Secondary | ICD-10-CM | POA: Diagnosis not present

## 2024-07-23 DIAGNOSIS — R7303 Prediabetes: Secondary | ICD-10-CM

## 2024-07-23 DIAGNOSIS — E66813 Obesity, class 3: Secondary | ICD-10-CM

## 2024-07-23 NOTE — Progress Notes (Signed)
 Renee Brennan, D.O.  ABFM, ABOM Specializing in Clinical Bariatric Medicine  Office located at: [address] [place], [place]  [address]    FOR THE CHRONIC DISEASE OF OBESITY:   Obesity, starting BMI 60.36; BMI 50.0-59.9, adult - Current BMI 55.22  Weight Summary and Body Composition Analysis  Weight Lost Since Last Visit: 0  Weight Gained Since Last Visit: 5lb    Vitals Temp: 98.1 F (36.7 C) BP: 101/68 Pulse Rate: 67 SpO2: 99 %   Anthropometric Measurements Height: 5' 2 (1.575 m) Weight: (!) 302 lb (137 kg) BMI (Calculated): 55.22 Weight at Last Visit: 297lb Weight Lost Since Last Visit: 0 Weight Gained Since Last Visit: 5lb Starting Weight: 330lb Total Weight Loss (lbs): 28 lb (12.7 kg) Peak Weight: 347lb   Body Composition  Body Fat %: 57.3 % Fat Mass (lbs): 173.4 lbs Muscle Mass (lbs): 122.6 lbs Visceral Fat Rating : 23   Other Clinical Data Fasting: yes Labs: no Today's Visit #: 17 Starting Date: 04/04/23    Chief complaint: Obesity  Interval History Renee Brennan is here for a follow-up office visit to discuss her progress with her obesity treatment plan. She is on the Category 3 Plan and states she is following her eating plan approximately 50 % of the time. She is walking 20-30  minutes 5 days per week  She has experienced a weight gain of 5 lbs since last OV on 05/28/2024.   Her dietary and life habits include:  - Tracking Calories/Macros: no, she is not on a journaling plan  - Eating More Whole Foods: no  - Does well with breakfast  - Typically has Healthy Choice meals for lunch  - Adequate Protein Intake: she struggles to get in her proteins  - Adequate Water Intake: no  - Skipping Meals: yes  - Sleeping 7-9 Hours/ Night: no    05/28/24 12:00 07/23/24 10:00   Body Fat % 57.1 % 57.3 %  Muscle Mass (lbs) 121 lbs 122.6 lbs  Fat Mass (lbs) 169.6 lbs 173.4 lbs  Visceral Fat Rating  23 23   Counseling done on  how various foods will affect these numbers and how to maximize success  Total lbs lost to date: - 28 lbs Total Fat Mass lost to date: - 26.6 lbs Total weight loss percentage to date: - 8.48 %   Recommended Dietary Goals Lavone is currently in the action stage of change. As such, her goal is to continue weight management plan.  She has agreed to switch to the CAT 2 MP.   Nutritional and Behavioral Counseling:  We discussed the following today: increasing lean protein intake to established goals, increasing fiber rich foods, work on meal planning and preparation, keeping healthy foods at home, discussed pre-packaged healthier meals such as [employer], and continue to work on implementation of reduced calorie nutritional plan.  Additional resources provided today: Handout on CAT 2 meal plan, Handout on CAT 1-2 breakfast options, and Handout on CAT 1-2 lunch options  Evidence-based interventions for health behavior change were utilized today including the discussion of self monitoring techniques, problem-solving barriers and SMART goal setting techniques.   Regarding patient's less desirable eating habits and patterns, we employed the technique of small changes.   SMART Goal(s) created today: meal planning/prepping   Recommended Physical Activity Goals Rayanna has been advised to work up to 300-450 minutes of moderate intensity aerobic activity a week and strengthening exercises 2-3 times per week for cardiovascular  health, weight loss maintenance and preservation of muscle mass.   She was encouraged to continue to gradually increase the amount and intensity of exercise routine   Medical Interventions and Pharmacotherapy Previous Bariatric surgery: n/a Pharmacotherapy: Continue with current nutritional and behavioral strategies   OBESITY RELATED CONDITIONS ADDRESSED TODAY:   Orders Placed This Encounter  Procedures   Hemoglobin A1c   Comprehensive metabolic panel  with GFR    Prediabetes Assessment & Plan: Lab Results  Component Value Date   HGBA1C 5.8 (H) 02/05/2024   HGBA1C 5.7 (H) 10/30/2023   HGBA1C 5.9 (H) 04/04/2023   INSULIN  8.6 02/05/2024   INSULIN  4.7 10/30/2023   INSULIN  10.4 04/04/2023    Pre-DM managed with dietary and life style interventions. No complaints of excessive hunger and cravings today. No acute concerns. Continue working on nutrition plan to decrease simple carbohydrates, increase lean proteins and exercise to promote weight loss and improve glycemic control and prevent progression to T2DM. Consider metformin in the future for Pre-DM and off label medical weight loss management. Recheck labs today.    Vitamin D  deficiency Assessment & Plan: Lab Results  Component Value Date   VD25OH 48.0 02/05/2024   VD25OH 31.4 04/04/2023   Currently on ergocalciferol  50,000 units weekly with good compliance and tolerance. No acute concerns. Continue regimen and weight loss efforts. Recheck as deemed medically necessary.    Visceral obesity Assessment & Plan: Current visceral fat rating: 23.  The visceral fat rating should be < 12 in a female  Visceral adipose tissue is a hormonally active component of total body fat. This body composition phenotype is associated with medical disorders such as metabolic syndrome, MASLD, cardiovascular disease and several malignancies including prostate, breast, and colorectal cancers. Continue goal of losing 7-10% of weight via prudent nutritional plan and lifestyle changes.     Objective:   PHYSICAL EXAM: Blood pressure 101/68, pulse 67, temperature 98.1 F (36.7 C), height 5' 2 (1.575 m), weight (!) 302 lb (137 kg), SpO2 99%. Body mass index is 55.24 kg/m.  General: she is overweight, cooperative and in no acute distress. PSYCH: Has normal mood, affect and thought process.   HEENT: EOMI, sclerae are anicteric. Lungs: Normal breathing effort, no conversational dyspnea. Extremities:  Moves * 4 Neurologic: A and O * 3, good insight  DIAGNOSTIC DATA REVIEWED: BMET    Component Value Date/Time   NA 140 02/05/2024 1033   K 4.4 02/05/2024 1033   CL 102 02/05/2024 1033   CO2 23 02/05/2024 1033   GLUCOSE 91 02/05/2024 1033   GLUCOSE 84 12/03/2019 1006   BUN 17 02/05/2024 1033   CREATININE 0.82 02/05/2024 1033   CALCIUM 9.1 02/05/2024 1033   GFRNONAA >60 12/03/2019 1006   GFRAA >60 12/03/2019 1006   Lab Results  Component Value Date   HGBA1C 5.8 (H) 02/05/2024   HGBA1C 5.9 (H) 04/04/2023   Lab Results  Component Value Date   INSULIN  8.6 02/05/2024   INSULIN  10.4 04/04/2023   No results found for: TSH CBC    Component Value Date/Time   WBC 6.9 10/30/2023 1142   RBC 4.32 10/30/2023 1142   HGB 12.5 10/30/2023 1142   HCT 39.0 10/30/2023 1142   PLT 272 10/30/2023 1142   MCV 90 10/30/2023 1142   MCH 28.9 10/30/2023 1142   MCHC 32.1 10/30/2023 1142   RDW 12.5 10/30/2023 1142   Iron Studies No results found for: IRON, TIBC, FERRITIN, IRONPCTSAT Lipid Panel     Component Value Date/Time  CHOL 134 02/05/2024 1033   TRIG 74 02/05/2024 1033   HDL 49 02/05/2024 1033   CHOLHDL 2.7 02/05/2024 1033   LDLCALC 70 02/05/2024 1033   Hepatic Function Panel     Component Value Date/Time   PROT 6.6 02/05/2024 1033   ALBUMIN 3.7 (L) 02/05/2024 1033   AST 16 02/05/2024 1033   ALT 13 02/05/2024 1033   ALKPHOS 90 02/05/2024 1033   BILITOT 0.7 02/05/2024 1033   No results found for: TSH Nutritional Lab Results  Component Value Date   VD25OH 48.0 02/05/2024   VD25OH 31.4 04/04/2023     Follow up:   Return for f/up in 1 month.  She was informed of the importance of frequent follow up visits to maximize her success with intensive lifestyle modifications for her multiple health conditions.  KAMPBELL HOLAWAY is aware that we will review all of her lab results at our next visit together in person.  She is aware that if anything is critical/ life  threatening with the results, we will be contacting her via MyChart or by my CMA will be calling them prior to the office visit to discuss acute management.    Attestations:   I, Special Puri, acting as a stage manager for [employer], DO., have compiled all relevant documentation for today's office visit on behalf of Renee Jenkins, DO, while in the presence of [employer], DO.  Pertinent positives were addressed with patient today. Reviewed by clinician on day of visit: allergies, medications, problem list, medical history, surgical history, family history, social history, and previous encounter notes.  I have reviewed the above documentation for accuracy and completeness, and I agree with the above. Renee JINNY Brennan, D.O.  The 21st Century Cures Act was signed into law in 2016 which includes the topic of electronic health records.  This provides immediate access to information in MyChart. This includes consultation notes, operative notes, office notes, lab results and pathology reports.  If you have any questions about what you read please let us  know at your next visit so we can discuss your concerns and take corrective action if need be.  We are right here with you.

## 2024-07-24 LAB — COMPREHENSIVE METABOLIC PANEL WITH GFR
ALT: 14 IU/L (ref 0–32)
AST: 22 IU/L (ref 0–40)
Albumin: 3.8 g/dL (ref 3.8–4.9)
Alkaline Phosphatase: 90 IU/L (ref 49–135)
BUN/Creatinine Ratio: 20 (ref 9–23)
BUN: 18 mg/dL (ref 6–24)
Bilirubin Total: 0.6 mg/dL (ref 0.0–1.2)
CO2: 23 mmol/L (ref 20–29)
Calcium: 9.1 mg/dL (ref 8.7–10.2)
Chloride: 104 mmol/L (ref 96–106)
Creatinine, Ser: 0.89 mg/dL (ref 0.57–1.00)
Globulin, Total: 3 g/dL (ref 1.5–4.5)
Glucose: 87 mg/dL (ref 70–99)
Potassium: 4.8 mmol/L (ref 3.5–5.2)
Sodium: 140 mmol/L (ref 134–144)
Total Protein: 6.8 g/dL (ref 6.0–8.5)
eGFR: 77 mL/min/1.73 (ref >59)

## 2024-07-24 LAB — HEMOGLOBIN A1C
Est. average glucose Bld gHb Est-mCnc: 120 mg/dL
Hgb A1c MFr Bld: 5.8 % — ABNORMAL HIGH (ref 4.8–5.6)

## 2024-08-18 ENCOUNTER — Other Ambulatory Visit (INDEPENDENT_AMBULATORY_CARE_PROVIDER_SITE_OTHER): Payer: Self-pay | Admitting: Family Medicine

## 2024-08-18 DIAGNOSIS — E559 Vitamin D deficiency, unspecified: Secondary | ICD-10-CM

## 2024-08-20 ENCOUNTER — Encounter (INDEPENDENT_AMBULATORY_CARE_PROVIDER_SITE_OTHER): Payer: Self-pay | Admitting: Family Medicine

## 2024-08-20 ENCOUNTER — Ambulatory Visit (INDEPENDENT_AMBULATORY_CARE_PROVIDER_SITE_OTHER): Payer: Self-pay | Admitting: Family Medicine

## 2024-08-20 VITALS — BP 98/66 | HR 59 | Temp 98.1°F | Ht 62.0 in | Wt 295.0 lb

## 2024-08-20 DIAGNOSIS — Z6841 Body Mass Index (BMI) 40.0 and over, adult: Secondary | ICD-10-CM | POA: Diagnosis not present

## 2024-08-20 DIAGNOSIS — E669 Obesity, unspecified: Secondary | ICD-10-CM | POA: Diagnosis not present

## 2024-08-20 DIAGNOSIS — E559 Vitamin D deficiency, unspecified: Secondary | ICD-10-CM

## 2024-08-20 DIAGNOSIS — R7303 Prediabetes: Secondary | ICD-10-CM

## 2024-08-20 DIAGNOSIS — E66813 Obesity, class 3: Secondary | ICD-10-CM

## 2024-08-20 MED ORDER — VITAMIN D (ERGOCALCIFEROL) 1.25 MG (50000 UNIT) PO CAPS: 50000.0000 [IU] | ORAL_CAPSULE | ORAL | 0 refills | Status: DC

## 2024-08-20 NOTE — Progress Notes (Signed)
 Renee Brennan, D.O.  ABFM, ABOM Specializing in Clinical Bariatric Medicine  Office located at: [address] [place], [place]  [address]    FOR THE CHRONIC DISEASE OF OBESITY:   Obesity, starting BMI 60.36 BMI 50.0-59.9, adult  -- Current BMI 53.94  Weight Summary and Body Composition Analysis  Weight Lost Since Last Visit: 7lb  Weight Gained Since Last Visit: 0lb    Vitals Temp: 98.1 F (36.7 C) BP: 98/66 Pulse Rate: (!) 59 SpO2: 100 %   Anthropometric Measurements Height: 5' 2 (1.575 m) Weight: 295 lb (133.8 kg) BMI (Calculated): 53.94 Weight at Last Visit: 302lb Weight Lost Since Last Visit: 7lb Weight Gained Since Last Visit: 0lb Starting Weight: 330lb Total Weight Loss (lbs): 35 lb (15.9 kg) Peak Weight: 347lb   Body Composition  Body Fat %: 56.6 % Fat Mass (lbs): 167 lbs Muscle Mass (lbs): 121.8 lbs Total Body Water (lbs): 97.8 lbs Visceral Fat Rating : 23   Other Clinical Data Fasting: yes Labs: no Today's Visit #: 18 Starting Date: 04/04/23    Chief complaint: Obesity  Interval History Renee Brennan is here for a follow-up office visit to discuss her progress with her obesity treatment plan. She is on the Category 2 Plan and states she is following her eating plan approximately 90 % of the time. She is walking 30  minutes 5 days per week  She has experienced a weight loss of 7 lbs since last OV on 07/23/2024. She attributes this to being more focused on her nutritional plan and working on meal preparation.  Her dietary and life habits include:  - Tracking Calories/Macros: yes   - Eating More Whole Foods: yes  - Adequate Protein Intake: yes  - Adequate Water Intake: no  - Skipping Meals: no  - Sleeping 7-9 Hours/ Night: no    07/23/24 10:00 08/20/24 11:00   Body Fat % 57.3 % 56.6 %  Muscle Mass (lbs) 122.6 lbs 121.8 lbs  Fat Mass (lbs) 173.4 lbs 167 lbs  Total Body Water (lbs)  97.8 lbs  Visceral Fat Rating  23  23   Counseling done on how various foods will affect these numbers and how to maximize success  Total Fat mass lost to date: - 33 lbs Total lbs lost to date: - 35 lbs Total weight loss percentage to date: - 10.61 %   Nutritional and Behavioral Counseling:  We discussed the following today: increasing lean protein intake to established goals, work on tracking and journaling calories using tracking application, continue to work on implementation of reduced calorie nutritional plan, and celebration eating strategies  Additional resources provided today: Handout on NEAT  Evidence-based interventions for health behavior change were utilized today including the discussion of self monitoring techniques, problem-solving barriers and SMART goal setting techniques.   Regarding patient's less desirable eating habits and patterns, we employed the technique of small changes.   SMART Goal(s) created today: n/a   Recommended Dietary Goals Renee Brennan is currently in the action stage of change. As such, her goal is to continue weight management plan.  She has agreed to continue the Cat 2 MP. She was also given the option to journal 1200-1300 cal and 85++ grams protein daily.    Recommended Physical Activity Goals Renee Brennan has been advised to work up to 300-450 minutes of moderate intensity aerobic activity a week and strengthening exercises 2-3 times per week for cardiovascular health, weight loss maintenance and preservation of muscle mass.  She has agreed to continue her walking regimen and INCREASE NEAT.   Medical Interventions and Pharmacotherapy Previous Bariatric surgery: n/a Pharmacotherapy:  Continue with current nutritional and behavioral strategies    OBESITY RELATED CONDITIONS ADDRESSED TODAY:    Medications Discontinued During This Encounter  Medication Reason   Vitamin D , Ergocalciferol , (DRISDOL ) 1.25 MG (50000 UNIT) CAPS capsule Reorder     Meds ordered this encounter   Medications   Vitamin D , Ergocalciferol , (DRISDOL ) 1.25 MG (50000 UNIT) CAPS capsule    Sig: Take 1 capsule (50,000 Units total) by mouth every 7 (seven) days.    Dispense:  12 capsule    Refill:  0     Prediabetes Assessment & Plan: Lab Results  Component Value Date   HGBA1C 5.8 (H) 07/23/2024   HGBA1C 5.8 (H) 02/05/2024   HGBA1C 5.7 (H) 10/30/2023   INSULIN  8.6 02/05/2024   INSULIN  4.7 10/30/2023   INSULIN  10.4 04/04/2023   Lab Results  Component Value Date   CREATININE 0.89 07/23/2024   BUN 18 07/23/2024   NA 140 07/23/2024   K 4.8 07/23/2024   CL 104 07/23/2024   CO2 23 07/23/2024      Component Value Date/Time   PROT 6.8 07/23/2024 1102   ALBUMIN 3.8 07/23/2024 1102   AST 22 07/23/2024 1102   ALT 14 07/23/2024 1102   ALKPHOS 90 07/23/2024 1102   BILITOT 0.6 07/23/2024 1102    Pre-DM managed with dietary and life style interventions. No complaints of excessive hunger or cravings today. Reviewed labs with pt. Kidney function, electrolytes, and liver enzymes are WNL. A1c stable and unchanged from last check on 02/05/2024. Pt aware of disease state and risk of progression to T2DM. Cont working on nutrition plan to decrease simple carbohydrates, increase lean proteins and exercise to promote weight loss and improve glycemic control .    Vitamin D  deficiency Assessment & Plan: Lab Results  Component Value Date   VD25OH 48.0 02/05/2024   VD25OH 31.4 04/04/2023   Reviewed labs with pt. Vit D level is essentially at goal of 50 to 70. Cont ergocalciferol  50,000 units weekly (refill today) and weight loss efforts. Recheck as deemed medically necessary.    Objective:   PHYSICAL EXAM: Blood pressure 98/66, pulse (!) 59, temperature 98.1 F (36.7 C), height 5' 2 (1.575 m), weight 295 lb (133.8 kg), SpO2 100%. Body mass index is 53.96 kg/m.  General: she is overweight, cooperative and in no acute distress. PSYCH: Has normal mood, affect and thought process.    HEENT: EOMI, sclerae are anicteric. Lungs: Normal breathing effort, no conversational dyspnea. Extremities: Moves * 4 Neurologic: A and O * 3, good insight  DIAGNOSTIC DATA REVIEWED: BMET    Component Value Date/Time   NA 140 07/23/2024 1102   K 4.8 07/23/2024 1102   CL 104 07/23/2024 1102   CO2 23 07/23/2024 1102   GLUCOSE 87 07/23/2024 1102   GLUCOSE 84 12/03/2019 1006   BUN 18 07/23/2024 1102   CREATININE 0.89 07/23/2024 1102   CALCIUM 9.1 07/23/2024 1102   GFRNONAA >60 12/03/2019 1006   GFRAA >60 12/03/2019 1006   Lab Results  Component Value Date   HGBA1C 5.8 (H) 07/23/2024   HGBA1C 5.9 (H) 04/04/2023   Lab Results  Component Value Date   INSULIN  8.6 02/05/2024   INSULIN  10.4 04/04/2023   No results found for: TSH CBC    Component Value Date/Time   WBC 6.9 10/30/2023 1142   RBC 4.32 10/30/2023 1142  HGB 12.5 10/30/2023 1142   HCT 39.0 10/30/2023 1142   PLT 272 10/30/2023 1142   MCV 90 10/30/2023 1142   MCH 28.9 10/30/2023 1142   MCHC 32.1 10/30/2023 1142   RDW 12.5 10/30/2023 1142   Iron Studies No results found for: IRON, TIBC, FERRITIN, IRONPCTSAT Lipid Panel     Component Value Date/Time   CHOL 134 02/05/2024 1033   TRIG 74 02/05/2024 1033   HDL 49 02/05/2024 1033   CHOLHDL 2.7 02/05/2024 1033   LDLCALC 70 02/05/2024 1033   Hepatic Function Panel     Component Value Date/Time   PROT 6.8 07/23/2024 1102   ALBUMIN 3.8 07/23/2024 1102   AST 22 07/23/2024 1102   ALT 14 07/23/2024 1102   ALKPHOS 90 07/23/2024 1102   BILITOT 0.6 07/23/2024 1102   No results found for: TSH Nutritional Lab Results  Component Value Date   VD25OH 48.0 02/05/2024   VD25OH 31.4 04/04/2023     Follow up:   Return 09/17/2024 at 11:40 AM.  She was informed of the importance of frequent follow up visits to maximize her success with intensive lifestyle modifications for her multiple health conditions.   Attestations:   I, Special Puri, acting as  a stage manager for [employer], DO., have compiled all relevant documentation for today's office visit on behalf of Renee Jenkins, DO, while in the presence of [employer], DO.  Pertinent positives were addressed with patient today. Reviewed by clinician on day of visit: allergies, medications, problem list, medical history, surgical history, family history, social history, and previous encounter notes.  I have reviewed the above documentation for accuracy and completeness, and I agree with the above. Renee Brennan, D.O.  The 21st Century Cures Act was signed into law in 2016 which includes the topic of electronic health records.  This provides immediate access to information in MyChart. This includes consultation notes, operative notes, office notes, lab results and pathology reports.  If you have any questions about what you read please let us  know at your next visit so we can discuss your concerns and take corrective action if need be.  We are right here with you.

## 2024-09-17 ENCOUNTER — Ambulatory Visit (INDEPENDENT_AMBULATORY_CARE_PROVIDER_SITE_OTHER): Admitting: Family Medicine

## 2024-09-17 ENCOUNTER — Encounter (INDEPENDENT_AMBULATORY_CARE_PROVIDER_SITE_OTHER): Payer: Self-pay | Admitting: Adult Health

## 2024-09-17 VITALS — BP 92/60 | HR 82 | Temp 98.2°F | Ht 62.0 in | Wt 286.0 lb

## 2024-09-17 DIAGNOSIS — R7303 Prediabetes: Secondary | ICD-10-CM | POA: Diagnosis not present

## 2024-09-17 DIAGNOSIS — E559 Vitamin D deficiency, unspecified: Secondary | ICD-10-CM

## 2024-09-17 DIAGNOSIS — Z6841 Body Mass Index (BMI) 40.0 and over, adult: Secondary | ICD-10-CM | POA: Diagnosis not present

## 2024-09-17 DIAGNOSIS — E65 Localized adiposity: Secondary | ICD-10-CM

## 2024-09-17 DIAGNOSIS — E669 Obesity, unspecified: Secondary | ICD-10-CM

## 2024-09-17 MED ORDER — VITAMIN D (ERGOCALCIFEROL) 1.25 MG (50000 UNIT) PO CAPS: 50000.0000 [IU] | ORAL_CAPSULE | ORAL | 0 refills | Status: DC

## 2024-09-17 NOTE — Progress Notes (Signed)
 WEIGHT SUMMARY AND BIOMETRICS  Vitals Temp: 98.2 F (36.8 C) BP: 92/60 Pulse Rate: 82 SpO2: 99 %   Anthropometric Measurements Height: 5' 2 (1.575 m) Weight: 286 lb (129.7 kg) BMI (Calculated): 52.3 Weight at Last Visit: 295lb Weight Lost Since Last Visit: 9lb Weight Gained Since Last Visit: 0lb Starting Weight: 330lb Total Weight Loss (lbs): 44 lb (20 kg) Peak Weight: 347lb   Body Composition  Body Fat %: 55.3 % Fat Mass (lbs): 158.4 lbs Muscle Mass (lbs): 121.6 lbs Total Body Water (lbs): 94.6 lbs Visceral Fat Rating : 21   Other Clinical Data Fasting: Yes Labs: No Today's Visit #: 19 Starting Date: 04/04/23    Chief Complaint:   OBESITY Renee Brennan is here to discuss her progress with her obesity treatment plan.  She is on the the Category 2 Plan and states she is following her eating plan approximately 95 % of the time.  She states she is exercising: Daily steps 8-9K  Interim History:  Reviewed Bioimpedance Results with pt: Muscle Mass: -0.2 lb Adipose Mass: -8.6 lbs  She is achieving steady weight loss sine late Spring 2025 She prefers not use oral or injectable AOMs  Subjective:   1. Prediabetes Lab Results  Component Value Date   HGBA1C 5.8 (H) 07/23/2024   HGBA1C 5.8 (H) 02/05/2024   HGBA1C 5.7 (H) 10/30/2023    A1c remains in pre-diabetic range She would like to avoid any antidiabetic medications  2. Visceral obesity  04/04/23 09:00  Visceral Fat Rating  27    09/17/24 12:00  Visceral Fat Rating  21   Visceral adiposity is reducing, yet still above goal of 10  3. Vitamin D  deficiency  Latest Reference Range & Units 04/04/23 10:49 02/05/24 10:33  Vitamin D , 25-Hydroxy 30.0 - 100.0 ng/mL 31.4 48.0   Vit D Level improving She is on weekly Ergocalciferol   Assessment/Plan:   1. Prediabetes Continue with healthy eating and daily walking Monitor Labs  2. Visceral obesity Continue with healthy eating and daily  walking Monitor Bioimpedance results  3. Vitamin D  deficiency Refill - Vitamin D , Ergocalciferol , (DRISDOL ) 1.25 MG (50000 UNIT) CAPS capsule; Take 1 capsule (50,000 Units total) by mouth every 7 (seven) days.  Dispense: 12 capsule; Refill: 0  4. BMI 50.0-59.9, adult  -- Current BMI 52.4 (Primary)  Renee Brennan is currently in the action stage of change. As such, her goal is to continue with weight loss efforts. She has agreed to the Category 2 Plan.   Exercise goals: All adults should avoid inactivity. Some physical activity is better than none, and adults who participate in any amount of physical activity gain some health benefits. Adults should also include muscle-strengthening activities that involve all major muscle groups on 2 or more days a week.  Behavioral modification strategies: increasing lean protein intake, decreasing simple carbohydrates, increasing vegetables, increasing water intake, no skipping meals, meal planning and cooking strategies, keeping healthy foods in the home, ways to avoid boredom eating, holiday eating strategies , and planning for success.  Renee Brennan has agreed to follow-up with our clinic in 4 weeks. She was informed of the importance of frequent follow-up visits to maximize her success with intensive lifestyle modifications for her multiple health conditions.   Objective:   Blood pressure 92/60, pulse 82, temperature 98.2 F (36.8 C), height 5' 2 (1.575 m), weight 286 lb (129.7 kg), SpO2 99%. Body mass index is 52.31 kg/m.  General: Cooperative, alert, well developed, in no acute distress. HEENT: Conjunctivae  and lids unremarkable. Cardiovascular: Regular rhythm.  Lungs: Normal work of breathing. Neurologic: No focal deficits.   Lab Results  Component Value Date   CREATININE 0.89 07/23/2024   BUN 18 07/23/2024   NA 140 07/23/2024   K 4.8 07/23/2024   CL 104 07/23/2024   CO2 23 07/23/2024   Lab Results  Component Value Date   ALT 14 07/23/2024    AST 22 07/23/2024   ALKPHOS 90 07/23/2024   BILITOT 0.6 07/23/2024   Lab Results  Component Value Date   HGBA1C 5.8 (H) 07/23/2024   HGBA1C 5.8 (H) 02/05/2024   HGBA1C 5.7 (H) 10/30/2023   HGBA1C 5.9 (H) 04/04/2023   Lab Results  Component Value Date   INSULIN  8.6 02/05/2024   INSULIN  4.7 10/30/2023   INSULIN  10.4 04/04/2023   No results found for: TSH Lab Results  Component Value Date   CHOL 134 02/05/2024   HDL 49 02/05/2024   LDLCALC 70 02/05/2024   TRIG 74 02/05/2024   CHOLHDL 2.7 02/05/2024   Lab Results  Component Value Date   VD25OH 48.0 02/05/2024   VD25OH 31.4 04/04/2023   Lab Results  Component Value Date   WBC 6.9 10/30/2023   HGB 12.5 10/30/2023   HCT 39.0 10/30/2023   MCV 90 10/30/2023   PLT 272 10/30/2023   No results found for: IRON, TIBC, FERRITIN  Attestation Statements:   Reviewed by clinician on day of visit: allergies, medications, problem list, medical history, surgical history, family history, social history, and previous encounter notes.  I have reviewed the above documentation for accuracy and completeness, and I agree with the above. -  Zyheir Daft d. Urijah Raynor, NP-C

## 2024-10-15 ENCOUNTER — Encounter (INDEPENDENT_AMBULATORY_CARE_PROVIDER_SITE_OTHER): Payer: Self-pay | Admitting: Family Medicine

## 2024-10-15 ENCOUNTER — Ambulatory Visit (INDEPENDENT_AMBULATORY_CARE_PROVIDER_SITE_OTHER): Admitting: Family Medicine

## 2024-10-15 DIAGNOSIS — R7303 Prediabetes: Secondary | ICD-10-CM

## 2024-10-15 DIAGNOSIS — Z6841 Body Mass Index (BMI) 40.0 and over, adult: Secondary | ICD-10-CM

## 2024-10-15 DIAGNOSIS — E559 Vitamin D deficiency, unspecified: Secondary | ICD-10-CM

## 2024-10-15 DIAGNOSIS — E65 Localized adiposity: Secondary | ICD-10-CM

## 2024-10-15 MED ORDER — VITAMIN D (ERGOCALCIFEROL) 1.25 MG (50000 UNIT) PO CAPS: 50000.0000 [IU] | ORAL_CAPSULE | ORAL | 1 refills | Status: AC

## 2024-10-15 NOTE — Progress Notes (Signed)
 "  [name], D.O.  ABFM, ABOM Specializing in Clinical Bariatric Medicine  Office located at: [address] [place], [place]  [address]      Medications Discontinued During This Encounter  Medication Reason   Vitamin D , Ergocalciferol , (DRISDOL ) 1.25 MG (50000 UNIT) CAPS capsule Reorder     Meds ordered this encounter  Medications   Vitamin D , Ergocalciferol , (DRISDOL ) 1.25 MG (50000 UNIT) CAPS capsule    Sig: Take 1 capsule (50,000 Units total) by mouth every 7 (seven) days.    Dispense:  4 capsule    Refill:  1      A) FOR THE CHRONIC DISEASE OF OBESITY:  Morbid obesity starting BMI 60.4 BMI 50.0-59.9, adult  -- Current BMI 52.11  Chief complaint: Obesity Renee Brennan is here to discuss her progress with her obesity treatment plan.   History of present illness / Interval history:  Renee Brennan is here today for her follow-up office visit.  Since last OV on 09/17/2024, pt is down 1 lb.   Pt has been sick recently and therefore was skipping some meals. Overall she adheres to MP.    09/17/24 12:00 10/15/24 11:00   Body Fat % 55.3 % 55.6 %  Muscle Mass (lbs) 121.6 lbs 120.2 lbs  Fat Mass (lbs) 158.4 lbs 158.6 lbs  Total Body Water (lbs) 94.6 lbs 95.4 lbs  Visceral Fat Rating  21 21  Counseling done on how various foods will affect these numbers and how to maximize success   Total lbs lost to date: -45 lbs Total Fat Mass in lbs lost to date: -41.4 Total weight loss percentage to date: -13.64 %   Nutrition Therapy She is on the Category 2 Plan and states she is following her eating plan approximately 90 % of the time.   - Tracking Calories/Macros: yes  - Eating More Whole Foods: yes  - Adequate Protein Intake: yes  - Adequate Water Intake: yes  - Skipping Meals: yes; only since she has been sick  - Sleeping 7-9 Hours/ Night: no   [name] is currently in the action stage of change. As such, her goal is to continue weight management  plan.  She has agreed to: continue current plan   Physical Activity Pt is walking 8,000-10,000 steps 5 days a week    [name] has been advised to work up to 300-450 minutes of moderate intensity aerobic activity a week and strengthening exercises 2-3 times per week for cardiovascular health, weight loss maintenance and preservation of muscle mass.  She has agreed to : Think about enjoyable ways to increase daily physical activity and overcoming barriers to exercise, Increase physical activity in their day and reduce sedentary time (increase NEAT)., Start strengthening exercises with a goal of 2-3 sessions a week , and Increase volume of physical activity to a goal of 240 minutes a week   Behavioral Modifications Evidence-based interventions for health behavior change were utilized today including the discussion of  1) self monitoring techniques:  journaling 2) self care:  exercise 3) SMART goals for next OV:  Start purposeful walking 15 minutes 3 days a week Regarding patient's less desirable eating habits and patterns, we employed the technique of small changes.   We discussed the following today: increasing lean protein intake to established goals, decreasing simple carbohydrates , avoiding skipping meals, increasing water intake , continue to work on implementation of reduced calorie nutritional plan, and continue to practice mindfulness when eating Additional resources provided today: None  Medical Interventions/ Pharmacotherapy Previous Bariatric surgery: none Pharmacotherapy for weight loss: She is not currently taking medications  for medical weight loss.    We discussed various medication options to help Mkayla with her weight loss efforts and we both agreed to : Continue with current nutritional and behavioral strategies   B) OBESITY RELATED CONDITIONS ADDRESSED TODAY:  Pt's most recent labs were reviewed today to determine ongoing medical management strategies and  plan. At next OV check VIT D and A1c, B12, CBC  Prediabetes Assessment & Plan Lab Results  Component Value Date   HGBA1C 5.8 (H) 07/23/2024   HGBA1C 5.8 (H) 02/05/2024   HGBA1C 5.7 (H) 10/30/2023   INSULIN  8.6 02/05/2024   INSULIN  4.7 10/30/2023   INSULIN  10.4 04/04/2023  Managed by dietary and lifestyle interventions. Hunger and cravings are well controlled. No acute concerns. Cont decreasing simple carbs and increasing lean proteins. Increase exercise as able.     Vitamin D  deficiency Assessment & Plan Lab Results  Component Value Date   VD25OH 48.0 02/05/2024   VD25OH 31.4 04/04/2023  Currently on Ergo 50 K units once weekly with good compliance and tolerance. Vit D levels are at goal. Cont regimen. Per pt, pharmacy only dispensed 1 month supply of Vit D, pt requested a refill. Recheck levels as necessary.    Visceral obesity Assessment & Plan Current visceral fat rating: 21.  The visceral fat rating should be < 12 in a female  Visceral adipose tissue is a hormonally active component of total body fat. This body composition phenotype is associated with medical disorders such as metabolic syndrome, cardiovascular disease and several malignancies including prostate, breast, and colorectal cancers. Cont losing 7-10% of weight via prudent nutritional plan and lifestyle changes.     Medications Discontinued During This Encounter  Medication Reason   Vitamin D , Ergocalciferol , (DRISDOL ) 1.25 MG (50000 UNIT) CAPS capsule Reorder     Meds ordered this encounter  Medications   Vitamin D , Ergocalciferol , (DRISDOL ) 1.25 MG (50000 UNIT) CAPS capsule    Sig: Take 1 capsule (50,000 Units total) by mouth every 7 (seven) days.    Dispense:  4 capsule    Refill:  1      Follow up:   Return 11/12/2024 11:40 AM.  She was informed of the importance of frequent follow up visits to maximize her success with intensive lifestyle modifications for her multiple health conditions.   Weight  Summary and Biometrics   Weight Lost Since Last Visit: 1lb  Weight Gained Since Last Visit: 0lb    Vitals Temp: 98.1 F (36.7 C) BP: 114/78 Pulse Rate: 70 SpO2: 98 %   Anthropometric Measurements Height: 5' 2 (1.575 m) Weight: 285 lb (129.3 kg) BMI (Calculated): 52.11 Weight at Last Visit: 286lb Weight Lost Since Last Visit: 1lb Weight Gained Since Last Visit: 0lb Starting Weight: 330lb Total Weight Loss (lbs): 45 lb (20.4 kg) Peak Weight: 347lb   Body Composition  Body Fat %: 55.6 % Fat Mass (lbs): 158.6 lbs Muscle Mass (lbs): 120.2 lbs Total Body Water (lbs): 95.4 lbs Visceral Fat Rating : 21   Other Clinical Data Fasting: no Labs: no Today's Visit #: 20 Starting Date: 04/04/23    Objective:   PHYSICAL EXAM: Blood pressure 114/78, pulse 70, temperature 98.1 F (36.7 C), height 5' 2 (1.575 m), weight 285 lb (129.3 kg), SpO2 98%. Body mass index is 52.13 kg/m.  General: she is overweight, cooperative and in no acute distress. PSYCH: Has normal mood, affect and  thought process.   HEENT: EOMI, sclerae are anicteric. Lungs: Normal breathing effort, no conversational dyspnea. Extremities: Moves * 4 Neurologic: A and O * 3, good insight  DIAGNOSTIC DATA REVIEWED: BMET    Component Value Date/Time   NA 140 07/23/2024 1102   K 4.8 07/23/2024 1102   CL 104 07/23/2024 1102   CO2 23 07/23/2024 1102   GLUCOSE 87 07/23/2024 1102   GLUCOSE 84 12/03/2019 1006   BUN 18 07/23/2024 1102   CREATININE 0.89 07/23/2024 1102   CALCIUM 9.1 07/23/2024 1102   GFRNONAA >60 12/03/2019 1006   GFRAA >60 12/03/2019 1006   Lab Results  Component Value Date   HGBA1C 5.8 (H) 07/23/2024   HGBA1C 5.9 (H) 04/04/2023   Lab Results  Component Value Date   INSULIN  8.6 02/05/2024   INSULIN  10.4 04/04/2023   No results found for: TSH CBC    Component Value Date/Time   WBC 6.9 10/30/2023 1142   RBC 4.32 10/30/2023 1142   HGB 12.5 10/30/2023 1142   HCT 39.0  10/30/2023 1142   PLT 272 10/30/2023 1142   MCV 90 10/30/2023 1142   MCH 28.9 10/30/2023 1142   MCHC 32.1 10/30/2023 1142   RDW 12.5 10/30/2023 1142   Iron Studies No results found for: IRON, TIBC, FERRITIN, IRONPCTSAT Lipid Panel     Component Value Date/Time   CHOL 134 02/05/2024 1033   TRIG 74 02/05/2024 1033   HDL 49 02/05/2024 1033   CHOLHDL 2.7 02/05/2024 1033   LDLCALC 70 02/05/2024 1033   Hepatic Function Panel     Component Value Date/Time   PROT 6.8 07/23/2024 1102   ALBUMIN 3.8 07/23/2024 1102   AST 22 07/23/2024 1102   ALT 14 07/23/2024 1102   ALKPHOS 90 07/23/2024 1102   BILITOT 0.6 07/23/2024 1102   No results found for: TSH Nutritional Lab Results  Component Value Date   VD25OH 48.0 02/05/2024   VD25OH 31.4 04/04/2023    Attestations:   I, Feliciano Mingle, acting as a stage manager for [employer], DO., have compiled all relevant documentation for today's office visit on behalf of Barnie Jenkins, DO, while in the presence of [employer], DO.   I have reviewed the above documentation for accuracy and completeness, and I agree with the above. Barnie JINNY Jenkins, D.O.  The 21st Century Cures Act was signed into law in 2016 which includes the topic of electronic health records.  This provides immediate access to information in MyChart.  This includes consultation notes, operative notes, office notes, lab results and pathology reports.  If you have any questions about what you read please let us  know at your next visit so we can discuss your concerns and take corrective action if need be.  We are right here with you.  "

## 2024-11-12 ENCOUNTER — Ambulatory Visit (INDEPENDENT_AMBULATORY_CARE_PROVIDER_SITE_OTHER): Admitting: Family Medicine
